# Patient Record
Sex: Female | Born: 1935
Health system: Southern US, Community
[De-identification: ages and names within clinical notes are randomized; demographics above are authoritative.]

## PROBLEM LIST (undated history)

## (undated) DIAGNOSIS — M1990 Unspecified osteoarthritis, unspecified site: Secondary | ICD-10-CM

## (undated) DIAGNOSIS — W19XXXA Unspecified fall, initial encounter: Secondary | ICD-10-CM

## (undated) DIAGNOSIS — R011 Cardiac murmur, unspecified: Secondary | ICD-10-CM

## (undated) DIAGNOSIS — E039 Hypothyroidism, unspecified: Secondary | ICD-10-CM

## (undated) DIAGNOSIS — Z87440 Personal history of urinary (tract) infections: Secondary | ICD-10-CM

## (undated) DIAGNOSIS — I10 Essential (primary) hypertension: Secondary | ICD-10-CM

## (undated) DIAGNOSIS — R32 Unspecified urinary incontinence: Secondary | ICD-10-CM

## (undated) DIAGNOSIS — K649 Unspecified hemorrhoids: Secondary | ICD-10-CM

## (undated) HISTORY — PX: TONSILLECTOMY: SUR1361

## (undated) HISTORY — PX: CHOLECYSTECTOMY: SHX55

## (undated) HISTORY — PX: ANKLE SURGERY: SHX546

## (undated) HISTORY — PX: APPENDECTOMY: SHX54

## (undated) HISTORY — PX: EYE SURGERY: SHX253

## (undated) HISTORY — PX: HEMORRHOID SURGERY: SHX153

## (undated) HISTORY — PX: OTHER SURGICAL HISTORY: SHX169

## (undated) HISTORY — PX: HERNIA REPAIR: SHX51

## (undated) HISTORY — PX: ABDOMINAL HYSTERECTOMY: SHX81

---

## 1998-06-06 ENCOUNTER — Observation Stay (HOSPITAL_COMMUNITY): Admission: RE | Admit: 1998-06-06 | Discharge: 1998-06-07 | Payer: Self-pay | Admitting: Obstetrics and Gynecology

## 1999-07-02 ENCOUNTER — Encounter: Payer: Self-pay | Admitting: Family Medicine

## 1999-07-02 ENCOUNTER — Encounter: Admission: RE | Admit: 1999-07-02 | Discharge: 1999-07-02 | Payer: Self-pay | Admitting: Family Medicine

## 2001-04-23 ENCOUNTER — Encounter: Admission: RE | Admit: 2001-04-23 | Discharge: 2001-04-23 | Payer: Self-pay | Admitting: Family Medicine

## 2001-04-23 ENCOUNTER — Encounter: Payer: Self-pay | Admitting: Family Medicine

## 2001-05-17 ENCOUNTER — Ambulatory Visit (HOSPITAL_COMMUNITY): Admission: RE | Admit: 2001-05-17 | Discharge: 2001-05-17 | Payer: Self-pay | Admitting: Gastroenterology

## 2001-09-01 ENCOUNTER — Encounter: Admission: RE | Admit: 2001-09-01 | Discharge: 2001-09-01 | Payer: Self-pay | Admitting: Family Medicine

## 2001-09-01 ENCOUNTER — Encounter: Payer: Self-pay | Admitting: Family Medicine

## 2001-11-01 ENCOUNTER — Encounter: Payer: Self-pay | Admitting: Family Medicine

## 2001-11-01 ENCOUNTER — Encounter: Admission: RE | Admit: 2001-11-01 | Discharge: 2001-11-01 | Payer: Self-pay | Admitting: Family Medicine

## 2002-05-12 DIAGNOSIS — W19XXXA Unspecified fall, initial encounter: Secondary | ICD-10-CM

## 2002-05-12 HISTORY — DX: Unspecified fall, initial encounter: W19.XXXA

## 2002-08-15 ENCOUNTER — Other Ambulatory Visit: Admission: RE | Admit: 2002-08-15 | Discharge: 2002-08-15 | Payer: Self-pay | Admitting: Obstetrics and Gynecology

## 2004-05-07 ENCOUNTER — Encounter: Admission: RE | Admit: 2004-05-07 | Discharge: 2004-05-07 | Payer: Self-pay | Admitting: Family Medicine

## 2004-06-06 ENCOUNTER — Ambulatory Visit (HOSPITAL_COMMUNITY): Admission: RE | Admit: 2004-06-06 | Discharge: 2004-06-06 | Payer: Self-pay | Admitting: Surgery

## 2004-06-06 ENCOUNTER — Encounter (INDEPENDENT_AMBULATORY_CARE_PROVIDER_SITE_OTHER): Payer: Self-pay | Admitting: *Deleted

## 2005-07-09 ENCOUNTER — Encounter: Admission: RE | Admit: 2005-07-09 | Discharge: 2005-07-09 | Payer: Self-pay | Admitting: Surgery

## 2008-02-02 ENCOUNTER — Encounter: Admission: RE | Admit: 2008-02-02 | Discharge: 2008-02-02 | Payer: Self-pay | Admitting: Family Medicine

## 2008-07-04 ENCOUNTER — Emergency Department (HOSPITAL_COMMUNITY): Admission: EM | Admit: 2008-07-04 | Discharge: 2008-07-04 | Payer: Self-pay | Admitting: Emergency Medicine

## 2008-07-04 IMAGING — CR DG PELVIS 1-2V
1 series · 1 of 1 positions shown · non-contrast
Comparison: CT abdomen pelvis [DATE]

CLINICAL DATA: Fall, right hip pain

PELVIS - 1-2 VIEW

[t pelvis a.p.]
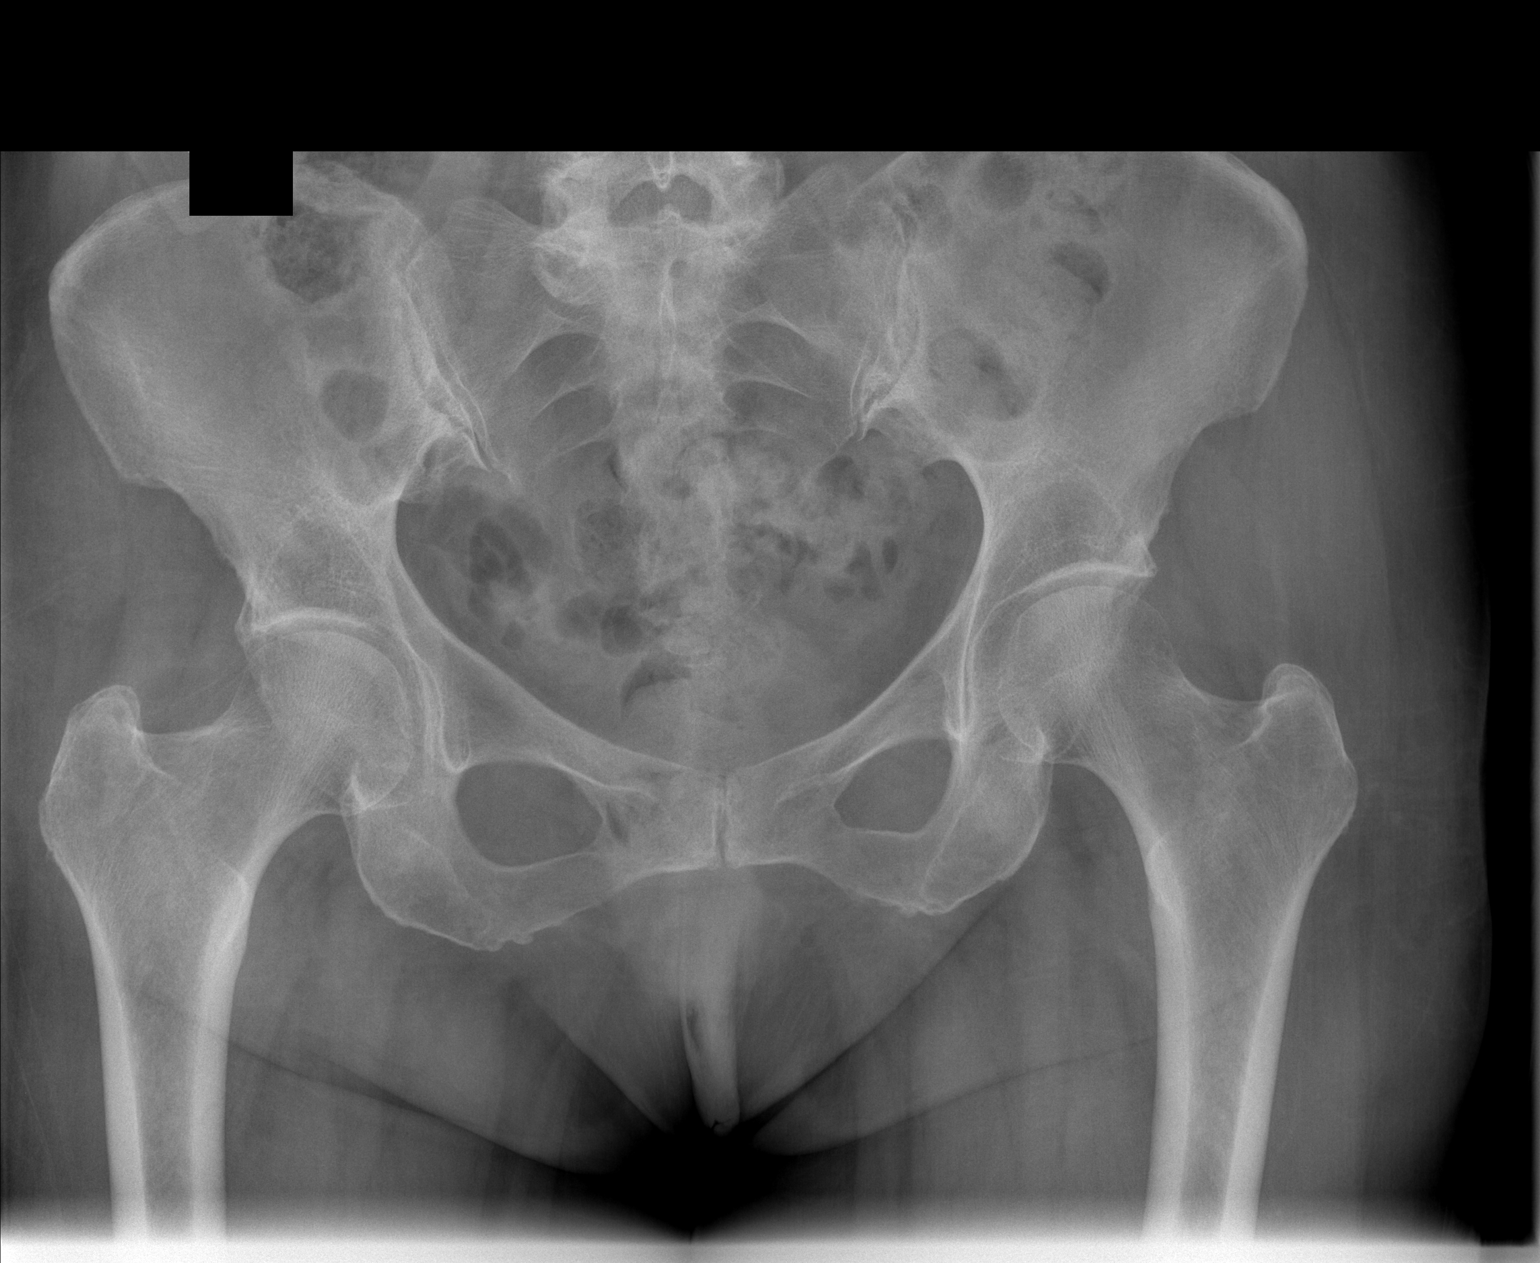

[1 of 1 positions shown; findings below may reference images not displayed]

FINDINGS: Hips are located.  No evidence of femoral neck fracture
on single view.  No evidence of pelvic fracture or sacral fracture.
IMPRESSION: No evidence of hip fracture or pelvic fracture.

## 2009-05-16 ENCOUNTER — Encounter: Admission: RE | Admit: 2009-05-16 | Discharge: 2009-05-16 | Payer: Self-pay | Admitting: Family Medicine

## 2009-05-17 ENCOUNTER — Emergency Department (HOSPITAL_COMMUNITY): Admission: EM | Admit: 2009-05-17 | Discharge: 2009-05-18 | Payer: Self-pay | Admitting: Emergency Medicine

## 2009-07-19 ENCOUNTER — Encounter: Admission: RE | Admit: 2009-07-19 | Discharge: 2009-07-19 | Payer: Self-pay | Admitting: Family Medicine

## 2010-06-01 ENCOUNTER — Encounter: Payer: Self-pay | Admitting: Family Medicine

## 2010-06-02 ENCOUNTER — Encounter: Payer: Self-pay | Admitting: Family Medicine

## 2010-08-27 LAB — COMPREHENSIVE METABOLIC PANEL
ALT: 125 U/L — ABNORMAL HIGH (ref 0–35)
Alkaline Phosphatase: 83 U/L (ref 39–117)
BUN: 8 mg/dL (ref 6–23)
CO2: 26 mEq/L (ref 19–32)
Chloride: 107 mEq/L (ref 96–112)
Glucose, Bld: 113 mg/dL — ABNORMAL HIGH (ref 70–99)
Potassium: 4.6 mEq/L (ref 3.5–5.1)
Sodium: 139 mEq/L (ref 135–145)
Total Bilirubin: 0.5 mg/dL (ref 0.3–1.2)
Total Protein: 6.7 g/dL (ref 6.0–8.3)

## 2010-08-27 LAB — DIFFERENTIAL
Basophils Absolute: 0 10*3/uL (ref 0.0–0.1)
Basophils Relative: 0 % (ref 0–1)
Eosinophils Absolute: 0 10*3/uL (ref 0.0–0.7)
Monocytes Relative: 3 % (ref 3–12)
Neutro Abs: 10.3 10*3/uL — ABNORMAL HIGH (ref 1.7–7.7)
Neutrophils Relative %: 92 % — ABNORMAL HIGH (ref 43–77)

## 2010-08-27 LAB — CBC
HCT: 36.8 % (ref 36.0–46.0)
Hemoglobin: 12.9 g/dL (ref 12.0–15.0)
RBC: 4.05 MIL/uL (ref 3.87–5.11)
WBC: 11.2 10*3/uL — ABNORMAL HIGH (ref 4.0–10.5)

## 2010-08-27 LAB — POCT CARDIAC MARKERS
CKMB, poc: <1 ng/mL — ABNORMAL LOW (ref 1.0–8.0)
Troponin i, poc: <0.05 ng/mL (ref 0.00–0.09)

## 2010-09-27 NOTE — Procedures (Signed)
[facility]  Patient:    Judy Lindsey, Judy Lindsey Visit Number: [id number] MRN: [id number]          Service Type: END Location: ENDO Attending Physician:  Nelda Marseille Dictated by:   Petra Kuba, M.D. Proc. Date: 05/17/01 Admit Date:  05/17/2001 Discharge Date: 05/17/2001   CC:         Delorse Lek, M.D.   Procedure Report  PROCEDURE PERFORMED:  Colonoscopy.  ENDOSCOPIST:  Petra Kuba, M.D.  INDICATIONS FOR PROCEDURE:  Patient with left lower quadrant pain, history of diverticula, overdue for colonic screening.  Consent was signed after risks, benefits, methods, and options were thoroughly discussed in the office.  MEDICATIONS USED:  Demerol 50 mg, Versed 5 mg.  DESCRIPTION OF PROCEDURE:  Rectal inspection was pertinent for external hemorrhoids small.  Digital exam was negative.  Pediatric video colonoscope was inserted, easily advanced around the colon to the cecum.  This did not require any position changes and only some mild left lower quadrant pressure. On insertion some left-sided diverticula were seen.  The cecum was identified by the appendiceal orifice and the ileocecal valve.  In fact the scope was inserted a short ways in the terminal ileum which was normal. Photodocumentation was obtained.  The scope was slowly withdrawn.  Prep was adequate.  There was some liquid stool that required washing and suctioning but on slow withdrawal through the colon, the cecum, ascending and transverse were normal.  As the scope was withdrawn around the colon, other than some left-sided diverticula, no abnormalities were seen.  Once back in the rectum, the scope was retroflexed, pertinent for some small internal hemorrhoids. Scope was straightened and readvanced a short ways around the left side of the colon.  Air was suctioned, scope removed.  The patient tolerated the procedure well.  There was no obvious immediate  complication.  ENDOSCOPIC DIAGNOSIS: 1. Internal and external small hemorrhoids. 2. Left-sided diverticula. 3. Otherwise within normal limits to the terminal ileum.  PLAN:  Yearly rectals and guaiacs per Dr. Doristine Counter.  Be happy to see back p.r.n.  Will give her the customary tic information.  Avoid nuts, seeds, popcorn, and consider surgical options if multiple bouts of recurring diverticulitis continue and otherwise repeat screening in five to 10 years. Dictated by:   Petra Kuba, M.D. Attending Physician:  Nelda Marseille DD:  05/17/01 TD:  05/17/01 Job: [phone] [id number]

## 2012-02-26 ENCOUNTER — Other Ambulatory Visit: Payer: Self-pay | Admitting: Gastroenterology

## 2012-03-09 ENCOUNTER — Other Ambulatory Visit: Payer: Self-pay | Admitting: Gastroenterology

## 2012-03-09 DIAGNOSIS — R109 Unspecified abdominal pain: Secondary | ICD-10-CM

## 2012-03-12 ENCOUNTER — Ambulatory Visit
Admission: RE | Admit: 2012-03-12 | Discharge: 2012-03-12 | Disposition: A | Discharge status: Home / Self Care | Payer: Medicare Other | Source: Ambulatory Visit | Attending: Gastroenterology | Admitting: Gastroenterology

## 2012-03-12 DIAGNOSIS — R109 Unspecified abdominal pain: Secondary | ICD-10-CM

## 2012-03-12 MED ORDER — IOHEXOL 300 MG/ML  SOLN
75.0000 mL | Freq: Once | INTRAMUSCULAR | Status: AC | PRN
  Administered 2012-03-12: 75 mL via INTRAVENOUS

## 2013-02-10 ENCOUNTER — Other Ambulatory Visit: Payer: Self-pay | Admitting: Family Medicine

## 2013-02-10 DIAGNOSIS — E039 Hypothyroidism, unspecified: Secondary | ICD-10-CM

## 2013-02-11 ENCOUNTER — Other Ambulatory Visit: Payer: Medicare Other

## 2013-02-14 ENCOUNTER — Ambulatory Visit
Admission: RE | Admit: 2013-02-14 | Discharge: 2013-02-14 | Disposition: A | Discharge status: Home / Self Care | Payer: Self-pay | Source: Ambulatory Visit | Attending: Family Medicine | Admitting: Family Medicine

## 2013-02-14 DIAGNOSIS — E039 Hypothyroidism, unspecified: Secondary | ICD-10-CM

## 2014-05-24 DIAGNOSIS — H26491 Other secondary cataract, right eye: Secondary | ICD-10-CM | POA: Diagnosis not present

## 2014-05-24 DIAGNOSIS — H04123 Dry eye syndrome of bilateral lacrimal glands: Secondary | ICD-10-CM | POA: Diagnosis not present

## 2014-08-17 ENCOUNTER — Other Ambulatory Visit: Payer: Self-pay | Admitting: Family Medicine

## 2014-08-17 DIAGNOSIS — M5137 Other intervertebral disc degeneration, lumbosacral region: Secondary | ICD-10-CM

## 2014-08-28 ENCOUNTER — Other Ambulatory Visit: Payer: BC Managed Care – PPO

## 2014-10-19 DIAGNOSIS — M810 Age-related osteoporosis without current pathological fracture: Secondary | ICD-10-CM | POA: Diagnosis not present

## 2014-10-19 DIAGNOSIS — M5137 Other intervertebral disc degeneration, lumbosacral region: Secondary | ICD-10-CM | POA: Diagnosis not present

## 2014-11-21 DIAGNOSIS — R350 Frequency of micturition: Secondary | ICD-10-CM | POA: Diagnosis not present

## 2014-12-14 DIAGNOSIS — E780 Pure hypercholesterolemia: Secondary | ICD-10-CM | POA: Diagnosis not present

## 2015-02-13 DIAGNOSIS — Z01818 Encounter for other preprocedural examination: Secondary | ICD-10-CM | POA: Diagnosis not present

## 2015-02-13 DIAGNOSIS — Z23 Encounter for immunization: Secondary | ICD-10-CM | POA: Diagnosis not present

## 2015-02-13 DIAGNOSIS — I10 Essential (primary) hypertension: Secondary | ICD-10-CM | POA: Diagnosis not present

## 2015-02-13 DIAGNOSIS — E559 Vitamin D deficiency, unspecified: Secondary | ICD-10-CM | POA: Diagnosis not present

## 2015-02-13 DIAGNOSIS — M810 Age-related osteoporosis without current pathological fracture: Secondary | ICD-10-CM | POA: Diagnosis not present

## 2015-02-13 DIAGNOSIS — E7800 Pure hypercholesterolemia, unspecified: Secondary | ICD-10-CM | POA: Diagnosis not present

## 2015-02-13 DIAGNOSIS — E039 Hypothyroidism, unspecified: Secondary | ICD-10-CM | POA: Diagnosis not present

## 2015-02-19 DIAGNOSIS — H02052 Trichiasis without entropian right lower eyelid: Secondary | ICD-10-CM | POA: Diagnosis not present

## 2015-02-19 DIAGNOSIS — H02055 Trichiasis without entropian left lower eyelid: Secondary | ICD-10-CM | POA: Diagnosis not present

## 2015-02-19 DIAGNOSIS — H5713 Ocular pain, bilateral: Secondary | ICD-10-CM | POA: Diagnosis not present

## 2015-03-29 DIAGNOSIS — M5137 Other intervertebral disc degeneration, lumbosacral region: Secondary | ICD-10-CM | POA: Diagnosis not present

## 2015-03-29 DIAGNOSIS — M1990 Unspecified osteoarthritis, unspecified site: Secondary | ICD-10-CM | POA: Diagnosis not present

## 2015-03-29 DIAGNOSIS — R5383 Other fatigue: Secondary | ICD-10-CM | POA: Diagnosis not present

## 2015-03-29 DIAGNOSIS — E7800 Pure hypercholesterolemia, unspecified: Secondary | ICD-10-CM | POA: Diagnosis not present

## 2015-04-11 DIAGNOSIS — M25562 Pain in left knee: Secondary | ICD-10-CM | POA: Diagnosis not present

## 2015-04-23 DIAGNOSIS — H02532 Eyelid retraction right lower eyelid: Secondary | ICD-10-CM | POA: Diagnosis not present

## 2015-04-23 DIAGNOSIS — H04222 Epiphora due to insufficient drainage, left lacrimal gland: Secondary | ICD-10-CM | POA: Diagnosis not present

## 2015-04-23 DIAGNOSIS — H02535 Eyelid retraction left lower eyelid: Secondary | ICD-10-CM | POA: Diagnosis not present

## 2015-04-23 DIAGNOSIS — H11433 Conjunctival hyperemia, bilateral: Secondary | ICD-10-CM | POA: Diagnosis not present

## 2015-04-23 DIAGNOSIS — M25562 Pain in left knee: Secondary | ICD-10-CM | POA: Diagnosis not present

## 2015-04-23 DIAGNOSIS — H04221 Epiphora due to insufficient drainage, right lacrimal gland: Secondary | ICD-10-CM | POA: Diagnosis not present

## 2015-04-23 DIAGNOSIS — M1712 Unilateral primary osteoarthritis, left knee: Secondary | ICD-10-CM | POA: Diagnosis not present

## 2015-05-30 DIAGNOSIS — M1712 Unilateral primary osteoarthritis, left knee: Secondary | ICD-10-CM | POA: Diagnosis not present

## 2015-05-30 DIAGNOSIS — M25562 Pain in left knee: Secondary | ICD-10-CM | POA: Diagnosis not present

## 2015-06-04 DIAGNOSIS — H02535 Eyelid retraction left lower eyelid: Secondary | ICD-10-CM | POA: Diagnosis not present

## 2015-06-04 DIAGNOSIS — H02532 Eyelid retraction right lower eyelid: Secondary | ICD-10-CM | POA: Diagnosis not present

## 2015-06-13 DIAGNOSIS — E039 Hypothyroidism, unspecified: Secondary | ICD-10-CM | POA: Diagnosis not present

## 2015-06-13 DIAGNOSIS — E7800 Pure hypercholesterolemia, unspecified: Secondary | ICD-10-CM | POA: Diagnosis not present

## 2015-06-13 DIAGNOSIS — R309 Painful micturition, unspecified: Secondary | ICD-10-CM | POA: Diagnosis not present

## 2015-06-13 DIAGNOSIS — E559 Vitamin D deficiency, unspecified: Secondary | ICD-10-CM | POA: Diagnosis not present

## 2015-06-15 NOTE — H&P (Signed)
TOTAL KNEE ADMISSION H&P  Patient is being admitted for left total knee arthroplasty.  Subjective:  Chief Complaint:   Left knee primary OA / pain  HPI: Dyanne Carrel, 80 y.o. female, has a history of pain and functional disability in the left knee due to arthritis and has failed non-surgical conservative treatments for greater than 12 weeks to include NSAID's and/or analgesics, corticosteriod injections and activity modification.  Onset of symptoms was gradual, starting 4-5 months ago with rapidlly worsening course since that time. The patient noted no past surgery on the left knee(s).  Patient currently rates pain in the left knee(s) at 10 out of 10 with activity. Patient has night pain, worsening of pain with activity and weight bearing, pain that interferes with activities of daily living, pain with passive range of motion, crepitus and joint swelling.  Patient has evidence of periarticular osteophytes and joint space narrowing by imaging studies.  There is no active infection.   Risks, benefits and expectations were discussed with the patient.  Risks including but not limited to the risk of anesthesia, blood clots, nerve damage, blood vessel damage, failure of the prosthesis, infection and up to and including death.  Patient understand the risks, benefits and expectations and wishes to proceed with surgery.   PCP: Astrid Divine, MD  D/C Plans:      SNF  Post-op Meds:       No Rx given   Tranexamic Acid:      To be given - IV   Decadron:      Is to be given  FYI:     ASA  Norco    Past Medical History  Diagnosis Date  . Heart murmur   . Hypertension   . Hypothyroidism   . History of urinary tract infection   . Urinary incontinence   . Arthritis   . Hemorrhoids   . Fall     down stairs     Past Surgical History  Procedure Laterality Date  . Hemorrhoid surgery    . Abdominal hysterectomy    . Cholecystectomy    . Appendectomy    . Ankle surgery      right had  screw placed still has   . Tonsillectomy    . Tummy tuck       20 years ago   . Eye surgery      cataract surgery bilat   . Hernia repair    . Arm surgery       had excess skin removed from under upper arm area bilat 05/16/2015     No prescriptions prior to admission   Allergies  Allergen Reactions  . Codeine Nausea And Vomiting  . Nubain [Nalbuphine] Nausea And Vomiting     Social History  Substance Use Topics  . Smoking status: Never Smoker   . Smokeless tobacco: Never Used  . Alcohol Use: Yes     Comment: occas glass of wine     No family history on file.   Review of Systems  Constitutional: Negative.   HENT: Negative.   Eyes: Negative.   Respiratory: Negative.   Cardiovascular: Negative.   Gastrointestinal: Negative.   Genitourinary: Negative.   Musculoskeletal: Positive for joint pain.  Skin: Negative.   Neurological: Negative.   Endo/Heme/Allergies: Positive for environmental allergies.  Psychiatric/Behavioral: Negative.     Objective:  Physical Exam  Constitutional: She is oriented to person, place, and time. She appears well-developed.  HENT:  Head: Normocephalic.  Eyes: Pupils  are equal, round, and reactive to light.  Neck: Neck supple. No JVD present. No tracheal deviation present. No thyromegaly present.  Cardiovascular: Normal rate, regular rhythm and intact distal pulses.   Murmur heard. Respiratory: Effort normal and breath sounds normal. No stridor. No respiratory distress. She has no wheezes.  GI: Soft. There is no tenderness. There is no guarding.  Musculoskeletal:       Left knee: She exhibits decreased range of motion, swelling and bony tenderness. She exhibits no ecchymosis, no deformity, no laceration and no erythema. Tenderness found.  Lymphadenopathy:    She has no cervical adenopathy.  Neurological: She is alert and oriented to person, place, and time.  Skin: Skin is warm and dry.  Psychiatric: She has a normal mood and affect.       Imaging Review Plain radiographs demonstrate severe degenerative joint disease of the left knee(s). The overall alignment is neutral. The bone quality appears to be good for age and reported activity level.  Assessment/Plan:  End stage arthritis, left knee   The patient history, physical examination, clinical judgment of the provider and imaging studies are consistent with end stage degenerative joint disease of the left knee(s) and total knee arthroplasty is deemed medically necessary. The treatment options including medical management, injection therapy arthroscopy and arthroplasty were discussed at length. The risks and benefits of total knee arthroplasty were presented and reviewed. The risks due to aseptic loosening, infection, stiffness, patella tracking problems, thromboembolic complications and other imponderables were discussed. The patient acknowledged the explanation, agreed to proceed with the plan and consent was signed. Patient is being admitted for inpatient treatment for surgery, pain control, PT, OT, prophylactic antibiotics, VTE prophylaxis, progressive ambulation and ADL's and discharge planning. The patient is planning to be discharged to skilled nursing facility.     Anastasio Auerbach Charelle Petrakis   PA-C  07/03/2015, 10:03 AM

## 2015-06-26 DIAGNOSIS — L814 Other melanin hyperpigmentation: Secondary | ICD-10-CM | POA: Diagnosis not present

## 2015-06-26 DIAGNOSIS — D1801 Hemangioma of skin and subcutaneous tissue: Secondary | ICD-10-CM | POA: Diagnosis not present

## 2015-06-26 DIAGNOSIS — L218 Other seborrheic dermatitis: Secondary | ICD-10-CM | POA: Diagnosis not present

## 2015-06-26 DIAGNOSIS — L853 Xerosis cutis: Secondary | ICD-10-CM | POA: Diagnosis not present

## 2015-06-26 DIAGNOSIS — D2271 Melanocytic nevi of right lower limb, including hip: Secondary | ICD-10-CM | POA: Diagnosis not present

## 2015-06-26 DIAGNOSIS — L821 Other seborrheic keratosis: Secondary | ICD-10-CM | POA: Diagnosis not present

## 2015-06-26 DIAGNOSIS — L280 Lichen simplex chronicus: Secondary | ICD-10-CM | POA: Diagnosis not present

## 2015-06-26 DIAGNOSIS — D2272 Melanocytic nevi of left lower limb, including hip: Secondary | ICD-10-CM | POA: Diagnosis not present

## 2015-07-02 ENCOUNTER — Encounter (HOSPITAL_COMMUNITY)
Admission: RE | Admit: 2015-07-02 | Discharge: 2015-07-02 | Disposition: A | Discharge status: Home / Self Care | Payer: Medicare PPO | Source: Ambulatory Visit | Attending: Orthopedic Surgery | Admitting: Orthopedic Surgery

## 2015-07-02 ENCOUNTER — Encounter (HOSPITAL_COMMUNITY): Payer: Self-pay

## 2015-07-02 DIAGNOSIS — M1712 Unilateral primary osteoarthritis, left knee: Secondary | ICD-10-CM | POA: Diagnosis not present

## 2015-07-02 DIAGNOSIS — Z0183 Encounter for blood typing: Secondary | ICD-10-CM | POA: Insufficient documentation

## 2015-07-02 DIAGNOSIS — Z01812 Encounter for preprocedural laboratory examination: Secondary | ICD-10-CM | POA: Insufficient documentation

## 2015-07-02 HISTORY — DX: Hypothyroidism, unspecified: E03.9

## 2015-07-02 HISTORY — DX: Essential (primary) hypertension: I10

## 2015-07-02 HISTORY — DX: Cardiac murmur, unspecified: R01.1

## 2015-07-02 HISTORY — DX: Personal history of urinary (tract) infections: Z87.440

## 2015-07-02 HISTORY — DX: Unspecified urinary incontinence: R32

## 2015-07-02 HISTORY — DX: Unspecified osteoarthritis, unspecified site: M19.90

## 2015-07-02 HISTORY — DX: Unspecified hemorrhoids: K64.9

## 2015-07-02 HISTORY — DX: Unspecified fall, initial encounter: W19.XXXA

## 2015-07-02 LAB — BASIC METABOLIC PANEL
Anion gap: 8 (ref 5–15)
BUN: 11 mg/dL (ref 6–20)
CALCIUM: 9.4 mg/dL (ref 8.9–10.3)
CHLORIDE: 106 mmol/L (ref 101–111)
CO2: 28 mmol/L (ref 22–32)
CREATININE: 0.52 mg/dL (ref 0.44–1.00)
GFR calc non Af Amer: >60 mL/min (ref >60)
GLUCOSE: 93 mg/dL (ref 65–99)
Potassium: 4.4 mmol/L (ref 3.5–5.1)
Sodium: 142 mmol/L (ref 135–145)

## 2015-07-02 LAB — SURGICAL PCR SCREEN
MRSA, PCR: NEGATIVE
STAPHYLOCOCCUS AUREUS: NEGATIVE

## 2015-07-02 LAB — CBC
HCT: 38.1 % (ref 36.0–46.0)
Hemoglobin: 12.2 g/dL (ref 12.0–15.0)
MCH: 29.6 pg (ref 26.0–34.0)
MCHC: 32 g/dL (ref 30.0–36.0)
MCV: 92.5 fL (ref 78.0–100.0)
PLATELETS: 241 10*3/uL (ref 150–400)
RBC: 4.12 MIL/uL (ref 3.87–5.11)
RDW: 13.8 % (ref 11.5–15.5)
WBC: 7.4 10*3/uL (ref 4.0–10.5)

## 2015-07-02 LAB — PROTIME-INR
INR: 0.97 (ref 0.00–1.49)
PROTHROMBIN TIME: 13.1 s (ref 11.6–15.2)

## 2015-07-02 LAB — APTT: aPTT: 29 seconds (ref 24–37)

## 2015-07-02 NOTE — Progress Notes (Signed)
Clearance note per chart per Dr Valentina Lucks 06/01/2015 EKG per chart 02/13/2015

## 2015-07-02 NOTE — Patient Instructions (Signed)
Judy Lindsey  07/02/2015   Your procedure is scheduled on: Tuesday July 10, 2015   Report to [facility] Main  Entrance take [place]  elevators to 3rd floor to  Short Stay Center at 12:45 PM.  Call this number if you have problems the morning of surgery [phone]   Remember: ONLY 1 PERSON MAY GO WITH YOU TO SHORT STAY TO GET  READY MORNING OF YOUR SURGERY.  Do not eat After Midnight but may take clear liquids till 9:45 am day of surgery then nothing by mouth.     Take these medicines the morning of surgery with A SIP OF WATER: Synthroid;                                You may not have any metal on your body including hair pins and              piercings  Do not wear jewelry, make-up, lotions, powders or perfumes, deodorant             Do not wear nail polish.  Do not shave  48 hours prior to surgery.              Men may shave face and neck.   Do not bring valuables to the hospital. [facility] IS NOT             RESPONSIBLE   FOR VALUABLES.  Contacts, dentures or bridgework may not be worn into surgery.                 Please read over the following fact sheets you were given:MRSA INFORMATION SHEET; INCENTIVE SPIROMETER; BLOOD TRANSFUSION INFORMATION SHEET  _____________________________________________________________________             [facility] - Preparing for Surgery Before surgery, you can play an important role.  Because skin is not sterile, your skin needs to be as free of germs as possible.  You can reduce the number of germs on your skin by washing with CHG (chlorahexidine gluconate) soap before surgery.  CHG is an antiseptic cleaner which kills germs and bonds with the skin to continue killing germs even after washing. Please DO NOT use if you have an allergy to CHG or antibacterial soaps.  If your skin becomes reddened/irritated stop using the CHG and inform your nurse when you arrive at Short Stay. Do not shave (including legs and underarms)  for at least 48 hours prior to the first CHG shower.  You may shave your face/neck. Please follow these instructions carefully:  1.  Shower with CHG Soap the night before surgery and the  morning of Surgery.  2.  If you choose to wash your hair, wash your hair first as usual with your  normal  shampoo.  3.  After you shampoo, rinse your hair and body thoroughly to remove the  shampoo.                           4.  Use CHG as you would any other liquid soap.  You can apply chg directly  to the skin and wash                       Gently with a scrungie or clean  washcloth.  5.  Apply the CHG Soap to your body ONLY FROM THE NECK DOWN.   Do not use on face/ open                           Wound or open sores. Avoid contact with eyes, ears mouth and genitals (private parts).                       Wash face,  Genitals (private parts) with your normal soap.             6.  Wash thoroughly, paying special attention to the area where your surgery  will be performed.  7.  Thoroughly rinse your body with warm water from the neck down.  8.  DO NOT shower/wash with your normal soap after using and rinsing off  the CHG Soap.                9.  Pat yourself dry with a clean towel.            10.  Wear clean pajamas.            11.  Place clean sheets on your bed the night of your first shower and do not  sleep with pets. Day of Surgery : Do not apply any lotions/deodorants the morning of surgery.  Please wear clean clothes to the hospital/surgery center.  FAILURE TO FOLLOW THESE INSTRUCTIONS MAY RESULT IN THE CANCELLATION OF YOUR SURGERY PATIENT SIGNATURE_________________________________  NURSE SIGNATURE__________________________________  ________________________________________________________________________    CLEAR LIQUID DIET   Foods Allowed                                                                     Foods Excluded  Coffee and tea, regular and decaf                             liquids  that you cannot  Plain Jell-O in any flavor                                             see through such as: Fruit ices (not with fruit pulp)                                     milk, soups, orange juice  Iced Popsicles                                    All solid food Carbonated beverages, regular and diet                                    Cranberry, grape and apple juices Sports drinks like Gatorade Lightly seasoned clear broth or consume(fat free) Sugar, honey syrup  Sample Menu Breakfast  Lunch                                     Supper Cranberry juice                    Beef broth                            Chicken broth Jell-O                                     Grape juice                           Apple juice Coffee or tea                        Jell-O                                      Popsicle                                                Coffee or tea                        Coffee or tea  _____________________________________________________________________    Incentive Spirometer  An incentive spirometer is a tool that can help keep your lungs clear and active. This tool measures how well you are filling your lungs with each breath. Taking long deep breaths may help reverse or decrease the chance of developing breathing (pulmonary) problems (especially infection) following:  A long period of time when you are unable to move or be active. BEFORE THE PROCEDURE   If the spirometer includes an indicator to show your best effort, your nurse or respiratory therapist will set it to a desired goal.  If possible, sit up straight or lean slightly forward. Try not to slouch.  Hold the incentive spirometer in an upright position. INSTRUCTIONS FOR USE   Sit on the edge of your bed if possible, or sit up as far as you can in bed or on a chair.  Hold the incentive spirometer in an upright position.  Breathe out normally.  Place the mouthpiece in  your mouth and seal your lips tightly around it.  Breathe in slowly and as deeply as possible, raising the piston or the ball toward the top of the column.  Hold your breath for 3-5 seconds or for as long as possible. Allow the piston or ball to fall to the bottom of the column.  Remove the mouthpiece from your mouth and breathe out normally.  Rest for a few seconds and repeat Steps 1 through 7 at least 10 times every 1-2 hours when you are awake. Take your time and take a few normal breaths between deep breaths.  The spirometer may include an indicator to show your best effort. Use the indicator as a goal to work toward during each repetition.  After each set of 10 deep breaths,  practice coughing to be sure your lungs are clear. If you have an incision (the cut made at the time of surgery), support your incision when coughing by placing a pillow or rolled up towels firmly against it. Once you are able to get out of bed, walk around indoors and cough well. You may stop using the incentive spirometer when instructed by your caregiver.  RISKS AND COMPLICATIONS  Take your time so you do not get dizzy or light-headed.  If you are in pain, you may need to take or ask for pain medication before doing incentive spirometry. It is harder to take a deep breath if you are having pain. AFTER USE  Rest and breathe slowly and easily.  It can be helpful to keep track of a log of your progress. Your caregiver can provide you with a simple table to help with this. If you are using the spirometer at home, follow these instructions: SEEK MEDICAL CARE IF:   You are having difficultly using the spirometer.  You have trouble using the spirometer as often as instructed.  Your pain medication is not giving enough relief while using the spirometer.  You develop fever of 100.5 F (38.1 C) or higher. SEEK IMMEDIATE MEDICAL CARE IF:   You cough up bloody sputum that had not been present before.  You  develop fever of 102 F (38.9 C) or greater.  You develop worsening pain at or near the incision site. MAKE SURE YOU:   Understand these instructions.  Will watch your condition.  Will get help right away if you are not doing well or get worse. Document Released: 09/08/2006 Document Revised: 07/21/2011 Document Reviewed: 11/09/2006 Exit[employer] Patient Information 2014 ExitCare, [place].   ________________________________________________________________________  WHAT IS A BLOOD TRANSFUSION? Blood Transfusion Information  A transfusion is the replacement of blood or some of its parts. Blood is made up of multiple cells which provide different functions.  Red blood cells carry oxygen and are used for blood loss replacement.  White blood cells fight against infection.  Platelets control bleeding.  Plasma helps clot blood.  Other blood products are available for specialized needs, such as hemophilia or other clotting disorders. BEFORE THE TRANSFUSION  Who gives blood for transfusions?   Healthy volunteers who are fully evaluated to make sure their blood is safe. This is blood bank blood. Transfusion therapy is the safest it has ever been in the practice of medicine. Before blood is taken from a donor, a complete history is taken to make sure that person has no history of diseases nor engages in risky social behavior (examples are intravenous drug use or sexual activity with multiple partners). The donor's travel history is screened to minimize risk of transmitting infections, such as malaria. The donated blood is tested for signs of infectious diseases, such as HIV and hepatitis. The blood is then tested to be sure it is compatible with you in order to minimize the chance of a transfusion reaction. If you or a relative donates blood, this is often done in anticipation of surgery and is not appropriate for emergency situations. It takes many days to process the donated blood. RISKS AND  COMPLICATIONS Although transfusion therapy is very safe and saves many lives, the main dangers of transfusion include:   Getting an infectious disease.  Developing a transfusion reaction. This is an allergic reaction to something in the blood you were given. Every precaution is taken to prevent this. The decision to have a blood transfusion has been  considered carefully by your caregiver before blood is given. Blood is not given unless the benefits outweigh the risks. AFTER THE TRANSFUSION  Right after receiving a blood transfusion, you will usually feel much better and more energetic. This is especially true if your red blood cells have gotten low (anemic). The transfusion raises the level of the red blood cells which carry oxygen, and this usually causes an energy increase.  The nurse administering the transfusion will monitor you carefully for complications. HOME CARE INSTRUCTIONS  No special instructions are needed after a transfusion. You may find your energy is better. Speak with your caregiver about any limitations on activity for underlying diseases you may have. SEEK MEDICAL CARE IF:   Your condition is not improving after your transfusion.  You develop redness or irritation at the intravenous (IV) site. SEEK IMMEDIATE MEDICAL CARE IF:  Any of the following symptoms occur over the next 12 hours:  Shaking chills.  You have a temperature by mouth above 102 F (38.9 C), not controlled by medicine.  Chest, back, or muscle pain.  People around you feel you are not acting correctly or are confused.  Shortness of breath or difficulty breathing.  Dizziness and fainting.  You get a rash or develop hives.  You have a decrease in urine output.  Your urine turns a dark color or changes to pink, red, or brown. Any of the following symptoms occur over the next 10 days:  You have a temperature by mouth above 102 F (38.9 C), not controlled by medicine.  Shortness of  breath.  Weakness after normal activity.  The white part of the eye turns yellow (jaundice).  You have a decrease in the amount of urine or are urinating less often.  Your urine turns a dark color or changes to pink, red, or brown. Document Released: 04/25/2000 Document Revised: 07/21/2011 Document Reviewed: 12/13/2007 [facility] Patient Information 2014 [place], [place].  _______________________________________________________________________

## 2015-07-03 LAB — URINALYSIS, ROUTINE W REFLEX MICROSCOPIC
Bilirubin Urine: NEGATIVE
GLUCOSE, UA: NEGATIVE mg/dL
HGB URINE DIPSTICK: NEGATIVE
KETONES UR: NEGATIVE mg/dL
Leukocytes, UA: NEGATIVE
Nitrite: NEGATIVE
PH: 7 (ref 5.0–8.0)
PROTEIN: NEGATIVE mg/dL
Specific Gravity, Urine: 1.005 (ref 1.005–1.030)

## 2015-07-03 LAB — ABO/RH: ABO/RH(D): O NEG

## 2015-07-05 NOTE — Progress Notes (Signed)
Spoke with pts husband Judy Lindsey in regards to surgical time change. Aware pt is to arrive at Short Stay / [facility] hospital 3rd floor at 9:45 am on 07/10/2015. Pts husband verbalized no food or drink after midnight.

## 2015-07-10 ENCOUNTER — Encounter (HOSPITAL_COMMUNITY)
Admission: RE | Disposition: A | Discharge status: Home Health | Payer: Self-pay | Source: Ambulatory Visit | Attending: Orthopedic Surgery

## 2015-07-10 ENCOUNTER — Encounter (HOSPITAL_COMMUNITY): Payer: Self-pay | Admitting: *Deleted

## 2015-07-10 ENCOUNTER — Inpatient Hospital Stay (HOSPITAL_COMMUNITY): Payer: Medicare PPO | Admitting: Anesthesiology

## 2015-07-10 ENCOUNTER — Inpatient Hospital Stay (HOSPITAL_COMMUNITY)
Admission: RE | Admit: 2015-07-10 | Discharge: 2015-07-13 | DRG: 470 | Disposition: A | Discharge status: Home Health | Payer: Medicare PPO | Source: Ambulatory Visit | Attending: Orthopedic Surgery | Admitting: Orthopedic Surgery

## 2015-07-10 DIAGNOSIS — I10 Essential (primary) hypertension: Secondary | ICD-10-CM | POA: Diagnosis present

## 2015-07-10 DIAGNOSIS — M659 Synovitis and tenosynovitis, unspecified: Secondary | ICD-10-CM | POA: Diagnosis present

## 2015-07-10 DIAGNOSIS — M1712 Unilateral primary osteoarthritis, left knee: Principal | ICD-10-CM | POA: Diagnosis present

## 2015-07-10 DIAGNOSIS — M1612 Unilateral primary osteoarthritis, left hip: Secondary | ICD-10-CM | POA: Diagnosis not present

## 2015-07-10 DIAGNOSIS — E663 Overweight: Secondary | ICD-10-CM | POA: Diagnosis not present

## 2015-07-10 DIAGNOSIS — M179 Osteoarthritis of knee, unspecified: Secondary | ICD-10-CM | POA: Diagnosis not present

## 2015-07-10 DIAGNOSIS — M25562 Pain in left knee: Secondary | ICD-10-CM | POA: Diagnosis present

## 2015-07-10 DIAGNOSIS — E039 Hypothyroidism, unspecified: Secondary | ICD-10-CM | POA: Diagnosis not present

## 2015-07-10 DIAGNOSIS — Z96659 Presence of unspecified artificial knee joint: Secondary | ICD-10-CM

## 2015-07-10 DIAGNOSIS — Z01812 Encounter for preprocedural laboratory examination: Secondary | ICD-10-CM

## 2015-07-10 DIAGNOSIS — Z6826 Body mass index (BMI) 26.0-26.9, adult: Secondary | ICD-10-CM

## 2015-07-10 DIAGNOSIS — Z96652 Presence of left artificial knee joint: Secondary | ICD-10-CM

## 2015-07-10 HISTORY — PX: TOTAL KNEE ARTHROPLASTY: SHX125

## 2015-07-10 LAB — TYPE AND SCREEN
ABO/RH(D): O NEG
Antibody Screen: NEGATIVE

## 2015-07-10 SURGERY — ARTHROPLASTY, KNEE, TOTAL
Anesthesia: Spinal | Site: Knee | Laterality: Left

## 2015-07-10 MED ORDER — CEFAZOLIN SODIUM-DEXTROSE 2-3 GM-% IV SOLR
2.0000 g | INTRAVENOUS | Status: AC
  Administered 2015-07-10: 2 g via INTRAVENOUS

## 2015-07-10 MED ORDER — PHENOL 1.4 % MT LIQD
1.0000 | OROMUCOSAL | Status: DC | PRN
  Filled 2015-07-10: qty 177

## 2015-07-10 MED ORDER — 0.9 % SODIUM CHLORIDE (POUR BTL) OPTIME
TOPICAL | Status: DC | PRN
  Administered 2015-07-10: 1000 mL

## 2015-07-10 MED ORDER — BUPIVACAINE-EPINEPHRINE (PF) 0.25% -1:200000 IJ SOLN
INTRAMUSCULAR | Status: AC
  Filled 2015-07-10: qty 30

## 2015-07-10 MED ORDER — METHOCARBAMOL 1000 MG/10ML IJ SOLN
500.0000 mg | Freq: Four times a day (QID) | INTRAVENOUS | Status: DC | PRN
  Administered 2015-07-10: 500 mg via INTRAVENOUS
  Filled 2015-07-10 (×2): qty 5

## 2015-07-10 MED ORDER — CEFAZOLIN SODIUM-DEXTROSE 2-3 GM-% IV SOLR
INTRAVENOUS | Status: AC
  Filled 2015-07-10: qty 50

## 2015-07-10 MED ORDER — DEXAMETHASONE SODIUM PHOSPHATE 10 MG/ML IJ SOLN
INTRAMUSCULAR | Status: AC
  Filled 2015-07-10: qty 1

## 2015-07-10 MED ORDER — POTASSIUM CHLORIDE 2 MEQ/ML IV SOLN
INTRAVENOUS | Status: DC
  Administered 2015-07-10 – 2015-07-11 (×2): via INTRAVENOUS
  Filled 2015-07-10 (×9): qty 1000

## 2015-07-10 MED ORDER — TRANEXAMIC ACID 1000 MG/10ML IV SOLN
1000.0000 mg | Freq: Once | INTRAVENOUS | Status: AC
  Administered 2015-07-10: 1000 mg via INTRAVENOUS
  Filled 2015-07-10: qty 10

## 2015-07-10 MED ORDER — ALUM & MAG HYDROXIDE-SIMETH 200-200-20 MG/5ML PO SUSP
30.0000 mL | ORAL | Status: DC | PRN
  Administered 2015-07-12: 30 mL via ORAL
  Filled 2015-07-10: qty 30

## 2015-07-10 MED ORDER — PHENYLEPHRINE HCL 10 MG/ML IJ SOLN
10.0000 mg | INTRAVENOUS | Status: DC | PRN
  Administered 2015-07-10: 40 ug/min via INTRAVENOUS

## 2015-07-10 MED ORDER — PROPOFOL 10 MG/ML IV BOLUS
INTRAVENOUS | Status: DC | PRN
  Administered 2015-07-10: 30 mg via INTRAVENOUS
  Administered 2015-07-10: 20 mg via INTRAVENOUS
  Administered 2015-07-10: 30 mg via INTRAVENOUS

## 2015-07-10 MED ORDER — FENTANYL CITRATE (PF) 100 MCG/2ML IJ SOLN
25.0000 ug | INTRAMUSCULAR | Status: DC | PRN
Start: 2015-07-10 — End: 2015-07-10
  Administered 2015-07-10 (×2): 50 ug via INTRAVENOUS

## 2015-07-10 MED ORDER — BUPIVACAINE IN DEXTROSE 0.75-8.25 % IT SOLN
INTRATHECAL | Status: DC | PRN
  Administered 2015-07-10: 1.8 mL via INTRATHECAL

## 2015-07-10 MED ORDER — LACTATED RINGERS IV SOLN
INTRAVENOUS | Status: DC
  Administered 2015-07-10: 16:00:00 via INTRAVENOUS
  Administered 2015-07-10: 1000 mL via INTRAVENOUS
  Administered 2015-07-10: 14:00:00 via INTRAVENOUS

## 2015-07-10 MED ORDER — PROMETHAZINE HCL 25 MG/ML IJ SOLN: 6.2500 mg | INTRAMUSCULAR | Status: DC | PRN

## 2015-07-10 MED ORDER — MEPERIDINE HCL 50 MG/ML IJ SOLN: 6.2500 mg | INTRAMUSCULAR | Status: DC | PRN

## 2015-07-10 MED ORDER — MAGNESIUM CITRATE PO SOLN: 1.0000 | Freq: Once | ORAL | Status: DC | PRN

## 2015-07-10 MED ORDER — HYDROMORPHONE HCL 1 MG/ML IJ SOLN
0.5000 mg | INTRAMUSCULAR | Status: DC | PRN
  Administered 2015-07-10 (×3): 0.5 mg via INTRAVENOUS
  Administered 2015-07-11 (×2): 1 mg via INTRAVENOUS
  Filled 2015-07-10 (×5): qty 1

## 2015-07-10 MED ORDER — ROSUVASTATIN CALCIUM 10 MG PO TABS
10.0000 mg | ORAL_TABLET | Freq: Every day | ORAL | Status: DC
  Filled 2015-07-10 (×4): qty 1

## 2015-07-10 MED ORDER — KETOROLAC TROMETHAMINE 30 MG/ML IJ SOLN
INTRAMUSCULAR | Status: AC
  Filled 2015-07-10: qty 1

## 2015-07-10 MED ORDER — FENTANYL CITRATE (PF) 100 MCG/2ML IJ SOLN
INTRAMUSCULAR | Status: DC | PRN
  Administered 2015-07-10: 50 ug via INTRAVENOUS

## 2015-07-10 MED ORDER — POLYETHYLENE GLYCOL 3350 17 G PO PACK
17.0000 g | PACK | Freq: Two times a day (BID) | ORAL | Status: DC
  Administered 2015-07-10 – 2015-07-13 (×6): 17 g via ORAL

## 2015-07-10 MED ORDER — METOCLOPRAMIDE HCL 5 MG/ML IJ SOLN: 5.0000 mg | Freq: Three times a day (TID) | INTRAMUSCULAR | Status: DC | PRN

## 2015-07-10 MED ORDER — BISACODYL 10 MG RE SUPP: 10.0000 mg | Freq: Every day | RECTAL | Status: DC | PRN

## 2015-07-10 MED ORDER — SODIUM CHLORIDE 0.9 % IJ SOLN
INTRAMUSCULAR | Status: DC | PRN
  Administered 2015-07-10: 30 mL

## 2015-07-10 MED ORDER — LEVOTHYROXINE SODIUM 100 MCG PO TABS
100.0000 ug | ORAL_TABLET | Freq: Every day | ORAL | Status: DC
  Administered 2015-07-11 – 2015-07-13 (×3): 100 ug via ORAL
  Filled 2015-07-10 (×4): qty 1

## 2015-07-10 MED ORDER — MIDAZOLAM HCL 5 MG/5ML IJ SOLN
INTRAMUSCULAR | Status: DC | PRN
  Administered 2015-07-10: 1 mg via INTRAVENOUS

## 2015-07-10 MED ORDER — ONDANSETRON HCL 4 MG/2ML IJ SOLN
4.0000 mg | Freq: Four times a day (QID) | INTRAMUSCULAR | Status: DC | PRN
  Administered 2015-07-11: 4 mg via INTRAVENOUS
  Filled 2015-07-10: qty 2

## 2015-07-10 MED ORDER — ASPIRIN EC 325 MG PO TBEC
325.0000 mg | DELAYED_RELEASE_TABLET | Freq: Two times a day (BID) | ORAL | Status: DC
  Administered 2015-07-11 – 2015-07-13 (×5): 325 mg via ORAL
  Filled 2015-07-10 (×7): qty 1

## 2015-07-10 MED ORDER — MENTHOL 3 MG MT LOZG: 1.0000 | LOZENGE | OROMUCOSAL | Status: DC | PRN

## 2015-07-10 MED ORDER — PHENYLEPHRINE HCL 10 MG/ML IJ SOLN
INTRAMUSCULAR | Status: AC
  Filled 2015-07-10: qty 1

## 2015-07-10 MED ORDER — METHOCARBAMOL 500 MG PO TABS
500.0000 mg | ORAL_TABLET | Freq: Four times a day (QID) | ORAL | Status: DC | PRN
  Administered 2015-07-10 – 2015-07-11 (×2): 500 mg via ORAL
  Filled 2015-07-10: qty 1

## 2015-07-10 MED ORDER — METOCLOPRAMIDE HCL 10 MG PO TABS: 5.0000 mg | ORAL_TABLET | Freq: Three times a day (TID) | ORAL | Status: DC | PRN

## 2015-07-10 MED ORDER — SODIUM CHLORIDE 0.9 % IJ SOLN
INTRAMUSCULAR | Status: AC
  Filled 2015-07-10: qty 50

## 2015-07-10 MED ORDER — ONDANSETRON HCL 4 MG PO TABS: 4.0000 mg | ORAL_TABLET | Freq: Four times a day (QID) | ORAL | Status: DC | PRN

## 2015-07-10 MED ORDER — ONDANSETRON HCL 4 MG/2ML IJ SOLN
INTRAMUSCULAR | Status: DC | PRN
  Administered 2015-07-10: 4 mg via INTRAVENOUS

## 2015-07-10 MED ORDER — BUPIVACAINE-EPINEPHRINE (PF) 0.25% -1:200000 IJ SOLN
INTRAMUSCULAR | Status: DC | PRN
  Administered 2015-07-10: 30 mL

## 2015-07-10 MED ORDER — MIDAZOLAM HCL 2 MG/2ML IJ SOLN
INTRAMUSCULAR | Status: AC
  Filled 2015-07-10: qty 2

## 2015-07-10 MED ORDER — DIPHENHYDRAMINE HCL 25 MG PO CAPS: 25.0000 mg | ORAL_CAPSULE | Freq: Four times a day (QID) | ORAL | Status: DC | PRN

## 2015-07-10 MED ORDER — HYDROCODONE-ACETAMINOPHEN 7.5-325 MG PO TABS
1.0000 | ORAL_TABLET | ORAL | Status: DC
  Administered 2015-07-10 – 2015-07-11 (×4): 2 via ORAL
  Filled 2015-07-10 (×4): qty 2

## 2015-07-10 MED ORDER — DEXAMETHASONE SODIUM PHOSPHATE 10 MG/ML IJ SOLN
10.0000 mg | Freq: Once | INTRAMUSCULAR | Status: AC
  Administered 2015-07-11: 10 mg via INTRAVENOUS
  Filled 2015-07-10: qty 1

## 2015-07-10 MED ORDER — PROPOFOL 500 MG/50ML IV EMUL
INTRAVENOUS | Status: DC | PRN
  Administered 2015-07-10: 50 ug/kg/min via INTRAVENOUS

## 2015-07-10 MED ORDER — FENTANYL CITRATE (PF) 100 MCG/2ML IJ SOLN
INTRAMUSCULAR | Status: AC
  Filled 2015-07-10: qty 2

## 2015-07-10 MED ORDER — FERROUS SULFATE 325 (65 FE) MG PO TABS
325.0000 mg | ORAL_TABLET | Freq: Three times a day (TID) | ORAL | Status: DC
  Administered 2015-07-11 – 2015-07-13 (×7): 325 mg via ORAL
  Filled 2015-07-10 (×10): qty 1

## 2015-07-10 MED ORDER — CHLORHEXIDINE GLUCONATE 4 % EX LIQD: 60.0000 mL | Freq: Once | CUTANEOUS | Status: DC

## 2015-07-10 MED ORDER — CEFAZOLIN SODIUM-DEXTROSE 2-3 GM-% IV SOLR
2.0000 g | Freq: Four times a day (QID) | INTRAVENOUS | Status: AC
Start: 2015-07-10 — End: 2015-07-11
  Administered 2015-07-11: 2 g via INTRAVENOUS
  Filled 2015-07-10 (×2): qty 50

## 2015-07-10 MED ORDER — DEXAMETHASONE SODIUM PHOSPHATE 10 MG/ML IJ SOLN
10.0000 mg | Freq: Once | INTRAMUSCULAR | Status: AC
  Administered 2015-07-10: 10 mg via INTRAVENOUS

## 2015-07-10 MED ORDER — KETOROLAC TROMETHAMINE 30 MG/ML IJ SOLN
INTRAMUSCULAR | Status: DC | PRN
  Administered 2015-07-10: 30 mg

## 2015-07-10 MED ORDER — DOCUSATE SODIUM 100 MG PO CAPS
100.0000 mg | ORAL_CAPSULE | Freq: Two times a day (BID) | ORAL | Status: DC
  Administered 2015-07-10 – 2015-07-13 (×6): 100 mg via ORAL

## 2015-07-10 MED ORDER — CELECOXIB 200 MG PO CAPS
200.0000 mg | ORAL_CAPSULE | Freq: Two times a day (BID) | ORAL | Status: DC
  Filled 2015-07-10 (×2): qty 1

## 2015-07-10 SURGICAL SUPPLY — 48 items
BAG DECANTER FOR FLEXI CONT (MISCELLANEOUS) IMPLANT
BAG SPEC THK2 15X12 ZIP CLS (MISCELLANEOUS)
BAG ZIPLOCK 12X15 (MISCELLANEOUS) IMPLANT
BANDAGE ACE 6X5 VEL STRL LF (GAUZE/BANDAGES/DRESSINGS) ×3 IMPLANT
BLADE SAW SGTL 13.0X1.19X90.0M (BLADE) ×3 IMPLANT
BOWL SMART MIX CTS (DISPOSABLE) ×3 IMPLANT
CAPT KNEE TOTAL 3 ATTUNE ×2 IMPLANT
CEMENT HV SMART SET (Cement) ×4 IMPLANT
CLOTH BEACON ORANGE TIMEOUT ST (SAFETY) ×3 IMPLANT
CUFF TOURN SGL QUICK 34 (TOURNIQUET CUFF) ×3
CUFF TRNQT CYL 34X4X40X1 (TOURNIQUET CUFF) ×1 IMPLANT
DECANTER SPIKE VIAL GLASS SM (MISCELLANEOUS) ×3 IMPLANT
DRAPE U-SHAPE 47X51 STRL (DRAPES) ×3 IMPLANT
DRESSING AQUACEL AG SP 3.5X10 (GAUZE/BANDAGES/DRESSINGS) IMPLANT
DRSG AQUACEL AG ADV 3.5X10 (GAUZE/BANDAGES/DRESSINGS) ×3 IMPLANT
DRSG AQUACEL AG SP 3.5X10 (GAUZE/BANDAGES/DRESSINGS) ×3
DURAPREP 26ML APPLICATOR (WOUND CARE) ×6 IMPLANT
ELECT REM PT RETURN 9FT ADLT (ELECTROSURGICAL) ×3
ELECTRODE REM PT RTRN 9FT ADLT (ELECTROSURGICAL) ×1 IMPLANT
GLOVE BIOGEL M 7.0 STRL (GLOVE) IMPLANT
GLOVE BIOGEL PI IND STRL 7.5 (GLOVE) ×1 IMPLANT
GLOVE BIOGEL PI IND STRL 8.5 (GLOVE) ×1 IMPLANT
GLOVE BIOGEL PI INDICATOR 7.5 (GLOVE) ×2
GLOVE BIOGEL PI INDICATOR 8.5 (GLOVE) ×2
GLOVE ECLIPSE 8.0 STRL XLNG CF (GLOVE) ×3 IMPLANT
GLOVE ORTHO TXT STRL SZ7.5 (GLOVE) ×6 IMPLANT
GOWN STRL REUS W/TWL LRG LVL3 (GOWN DISPOSABLE) ×3 IMPLANT
GOWN STRL REUS W/TWL XL LVL3 (GOWN DISPOSABLE) ×3 IMPLANT
HANDPIECE INTERPULSE COAX TIP (DISPOSABLE) ×3
LIQUID BAND (GAUZE/BANDAGES/DRESSINGS) ×3 IMPLANT
MANIFOLD NEPTUNE II (INSTRUMENTS) ×3 IMPLANT
PACK TOTAL KNEE CUSTOM (KITS) ×3 IMPLANT
POSITIONER SURGICAL ARM (MISCELLANEOUS) ×3 IMPLANT
SET HNDPC FAN SPRY TIP SCT (DISPOSABLE) ×1 IMPLANT
SET PAD KNEE POSITIONER (MISCELLANEOUS) ×3 IMPLANT
SUCTION FRAZIER HANDLE 12FR (TUBING) ×2
SUCTION TUBE FRAZIER 12FR DISP (TUBING) ×1 IMPLANT
SUT MNCRL AB 4-0 PS2 18 (SUTURE) ×3 IMPLANT
SUT VIC AB 1 CT1 36 (SUTURE) ×3 IMPLANT
SUT VIC AB 2-0 CT1 27 (SUTURE) ×9
SUT VIC AB 2-0 CT1 TAPERPNT 27 (SUTURE) ×3 IMPLANT
SUT VLOC 180 0 24IN GS25 (SUTURE) ×3 IMPLANT
SYR 50ML LL SCALE MARK (SYRINGE) ×1 IMPLANT
TRAY FOLEY W/METER SILVER 14FR (SET/KITS/TRAYS/PACK) ×3 IMPLANT
TRAY FOLEY W/METER SILVER 16FR (SET/KITS/TRAYS/PACK) ×1 IMPLANT
WATER STERILE IRR 1500ML POUR (IV SOLUTION) ×3 IMPLANT
WRAP KNEE MAXI GEL POST OP (GAUZE/BANDAGES/DRESSINGS) ×3 IMPLANT
YANKAUER SUCT BULB TIP 10FT TU (MISCELLANEOUS) ×3 IMPLANT

## 2015-07-10 NOTE — Anesthesia Preprocedure Evaluation (Addendum)
Anesthesia Evaluation  Patient identified by MRN, date of birth, ID band Patient awake    Reviewed: Allergy & Precautions, NPO status , Patient's Chart, lab work & pertinent test results  Airway Mallampati: II  TM Distance: >3 FB Neck ROM: Full    Dental no notable dental hx.  Upper front veneers:   Pulmonary neg pulmonary ROS,    Pulmonary exam normal breath sounds clear to auscultation       Cardiovascular hypertension, Pt. on medications Normal cardiovascular exam Rhythm:Regular Rate:Normal     Neuro/Psych negative neurological ROS  negative psych ROS   GI/Hepatic negative GI ROS, Neg liver ROS,   Endo/Other  negative endocrine ROS  Renal/GU negative Renal ROS  negative genitourinary   Musculoskeletal negative musculoskeletal ROS (+)   Abdominal   Peds negative pediatric ROS (+)  Hematology negative hematology ROS (+)   Anesthesia Other Findings   Reproductive/Obstetrics negative OB ROS                            Anesthesia Physical Anesthesia Plan  ASA: II  Anesthesia Plan: Spinal   Post-op Pain Management:    Induction:   Airway Management Planned: Simple Face Mask  Additional Equipment:   Intra-op Plan:   Post-operative Plan:   Informed Consent: I have reviewed the patients History and Physical, chart, labs and discussed the procedure including the risks, benefits and alternatives for the proposed anesthesia with the patient or authorized representative who has indicated his/her understanding and acceptance.   Dental advisory given  Plan Discussed with: CRNA  Anesthesia Plan Comments:         Anesthesia Quick Evaluation

## 2015-07-10 NOTE — Discharge Instructions (Signed)

## 2015-07-10 NOTE — Anesthesia Postprocedure Evaluation (Signed)
Anesthesia Post Note  Patient: Judy Lindsey  Procedure(s) Performed: Procedure(s) (LRB): LEFT TOTAL KNEE ARTHROPLASTY (Left)  Patient location during evaluation: PACU Anesthesia Type: Spinal Level of consciousness: oriented and awake and alert Pain management: pain level controlled Vital Signs Assessment: post-procedure vital signs reviewed and stable Respiratory status: spontaneous breathing, respiratory function stable and patient connected to nasal cannula oxygen Cardiovascular status: blood pressure returned to baseline and stable Postop Assessment: no headache and no backache Anesthetic complications: no    Last Vitals:  Filed Vitals:   07/10/15 1515 07/10/15 1530  BP: 126/65 138/64  Pulse: 62 51  Temp:    Resp: 13 11    Last Pain:  Filed Vitals:   07/10/15 1532  PainSc: 0-No pain            L Sensory Level: L3-Anterior knee, lower leg (07/10/15 1530) R Sensory Level: L3-Anterior knee, lower leg (07/10/15 1530)  Phillips Grout

## 2015-07-10 NOTE — Anesthesia Procedure Notes (Signed)
Spinal Patient location during procedure: OR Start time: 07/10/2015 1:06 PM End time: 07/10/2015 1:15 PM Reason for block: at surgeon's request Staffing Resident/CRNA: Anne Fu Performed by: resident/CRNA  Preanesthetic Checklist Completed: patient identified, site marked, surgical consent, pre-op evaluation, timeout performed, IV checked, risks and benefits discussed, monitors and equipment checked and at surgeon's request Spinal Block Patient position: sitting Prep: Betadine Patient monitoring: heart rate, cardiac monitor, continuous pulse ox and blood pressure Approach: midline Location: L2-3 Injection technique: single-shot Needle Needle type: Spinocan  Needle gauge: 22 G Needle length: 9 cm Assessment Sensory level: T6 Additional Notes Expiration date of kit checked and confirmed. Patient tolerated procedure well, without complications. X 2 attempt with noted clear CSF return. Loss of motor and sensory on exam post injection, unable to obtain via right paramedian and converted to midline with noted return of CSF ease of aspiration and administration of medication.

## 2015-07-10 NOTE — Interval H&P Note (Signed)
History and Physical Interval Note:  07/10/2015 12:48 PM  Judy Lindsey  has presented today for surgery, with the diagnosis of left knee osteoarthritis  The various methods of treatment have been discussed with the patient and family. After consideration of risks, benefits and other options for treatment, the patient has consented to  Procedure(s): LEFT TOTAL KNEE ARTHROPLASTY (Left) as a surgical intervention .  The patient's history has been reviewed, patient examined, no change in status, stable for surgery.  I have reviewed the patient's chart and labs.  Questions were answered to the patient's satisfaction.     Shelda Pal

## 2015-07-10 NOTE — Transfer of Care (Signed)
Immediate Anesthesia Transfer of Care Note  Patient: Judy Lindsey  Procedure(s) Performed: Procedure(s): LEFT TOTAL KNEE ARTHROPLASTY (Left)  Patient Location: PACU  Anesthesia Type:MAC and Spinal  Level of Consciousness:  sedated, patient cooperative and responds to stimulation  Airway & Oxygen Therapy:Patient Spontanous Breathing and Patient connected to face mask oxgen  Post-op Assessment:  Report given to PACU RN and Post -op Vital signs reviewed and stable  Post vital signs:  Reviewed and stable  Last Vitals:  Filed Vitals:   07/10/15 0949  BP: 153/63  Pulse: 69  Temp: 36.5 C  Resp: 16    Complications: No apparent anesthesia complications

## 2015-07-10 NOTE — Op Note (Signed)
NAME:  Judy Lindsey                      MEDICAL RECORD NO.:  [id number]                             FACILITY:  [facility]      PHYSICIAN:  Madlyn Frankel. Charlann Boxer, M.D.  DATE OF BIRTH:  1935-07-05      DATE OF PROCEDURE:  07/10/2015                                     OPERATIVE REPORT         PREOPERATIVE DIAGNOSIS:  Left knee osteoarthritis.      POSTOPERATIVE DIAGNOSIS:  Left knee osteoarthritis.      FINDINGS:  The patient was noted to have complete loss of cartilage and   bone-on-bone arthritis with associated osteophytes in the lateral and patellofemoral compartments of   the knee with a significant synovitis and associated effusion.      PROCEDURE:  Left total knee replacement.      COMPONENTS USED:  DePuy Attune rotating platform posterior stabilized knee   system, a size 4N femur, 3 tibia, size 5 mm PS AOX insert, and 35 anatomic patellar   button.      SURGEON:  Madlyn Frankel. Charlann Boxer, M.D.      ASSISTANT:  Lanney Gins, PA-C.      ANESTHESIA:  Spinal.      SPECIMENS:  None.      COMPLICATION:  None.      DRAINS:  None  EBL: <100cc      TOURNIQUET TIME:   Total Tourniquet Time Documented: Thigh (Left) - 21 minutes Total: Thigh (Left) - 21 minutes      The patient was stable to the recovery room.      INDICATION FOR PROCEDURE:  Judy Lindsey is a 80 y.o. female patient of   mine.  The patient had been seen, evaluated, and treated conservatively in the   office with medication, activity modification, and injections.  The patient had   radiographic changes of bone-on-bone arthritis with endplate sclerosis and osteophytes noted.      The patient failed conservative measures including medication, injections, and activity modification, and at this point was ready for more definitive measures.   Based on the radiographic changes and failed conservative measures, the patient   decided to proceed with total knee replacement.  Risks of infection,   DVT, component failure, need for  revision surgery, postop course, and   expectations were all   discussed and reviewed.  Consent was obtained for benefit of pain   relief.      PROCEDURE IN DETAIL:  The patient was brought to the operative theater.   Once adequate anesthesia, preoperative antibiotics, 2 gm of Ancef, 1 gm of Tranexamic Acid, and 10 mg of Decadron administered, the patient was positioned supine with the left thigh tourniquet placed.  The  left lower extremity was prepped and draped in sterile fashion.  A time-   out was performed identifying the patient, planned procedure, and   extremity.      The left lower extremity was placed in the [employer] leg holder.  The leg was   exsanguinated, tourniquet elevated to 250 mmHg.  A midline incision was   made followed by  median parapatellar arthrotomy.  Following initial   exposure, attention was first directed to the patella.  Precut   measurement was noted to be 21 mm.  I resected down to 13 mm and used a   35 anatomic patellar button to restore patellar height as well as cover the cut   surface.      The lug holes were drilled and a metal shim was placed to protect the   patella from retractors and saw blades.      At this point, attention was now directed to the femur.  The femoral   canal was opened with a drill, irrigated to try to prevent fat emboli.  An   intramedullary rod was passed at 3 degrees valgus, 8 mm of bone was   resected off the distal femur.  Following this resection, the tibia was   subluxated anteriorly.  Using the extramedullary guide, 2 mm of bone was resected off   the proximal lateral tibia.  We confirmed the gap would be   stable medially and laterally with a size 5 mm insert as well as confirmed   the cut was perpendicular in the coronal plane, checking with an alignment rod.      Once this was done, I sized the femur to be a size 4 in the anterior-   posterior dimension, chose a narrow component based on medial and   lateral  dimension.  The size 4 rotation block was then pinned in   position anterior referenced using the C-clamp to set rotation.  The   anterior, posterior, and  chamfer cuts were made without difficulty nor   notching making certain that I was along the anterior cortex to help   with flexion gap stability.      The final box cut was made off the lateral aspect of distal femur.      At this point, the tibia was sized to be a size 3, the size 3 tray was   then pinned in position through the medial third of the tubercle,   drilled, and keel punched.  Trial reduction was now carried with a 4 femur,  3 tibia, a size 5 mm insert, and the 35 patella botton.  The knee was brought to   extension, full extension with good flexion stability with the patella   tracking through the trochlea without application of pressure.  Given   all these findings the femoral lug holes were drilled and then the trial components removed.  Final components were   opened and cement was mixed.  The knee was irrigated with normal saline   solution and pulse lavage.  The synovial lining was   then injected with 30 cc of 0.25% Marcaine with epinephrine and 1 cc of Toradol plus 30 cc of NS for a    total of 61 cc.      The knee was irrigated.  Final implants were then cemented onto clean and   dried cut surfaces of bone with the knee brought to extension with a size 5 mm trial insert.      Once the cement had fully cured, the excess cement was removed   throughout the knee.  I confirmed I was satisfied with the range of   motion and stability, and the final size 5 mm PS AOX insert was chosen.  It was   placed into the knee.      The tourniquet had been let down at 21 minutes.  No significant   hemostasis required.  The   extensor mechanism was then reapproximated using #1 Vicryl and #0 V-lock sutures with the knee   in flexion.  The   remaining wound was closed with 2-0 Vicryl and running 4-0 Monocryl.   The knee was  cleaned, dried, dressed sterilely using Dermabond and   Aquacel dressing.  The patient was then   brought to recovery room in stable condition, tolerating the procedure   well.   Please note that Physician Assistant, Lanney Gins, PA-C, was present for the entirety of the case, and was utilized for pre-operative positioning, peri-operative retractor management, general facilitation of the procedure.  He was also utilized for primary wound closure at the end of the case.              Madlyn Frankel Charlann Boxer, M.D.    07/10/2015 2:23 PM

## 2015-07-10 NOTE — Progress Notes (Signed)
Utilization review completed.  

## 2015-07-11 DIAGNOSIS — E663 Overweight: Secondary | ICD-10-CM | POA: Diagnosis present

## 2015-07-11 LAB — CBC
HEMATOCRIT: 33.8 % — AB (ref 36.0–46.0)
Hemoglobin: 11 g/dL — ABNORMAL LOW (ref 12.0–15.0)
MCH: 29.2 pg (ref 26.0–34.0)
MCHC: 32.5 g/dL (ref 30.0–36.0)
MCV: 89.7 fL (ref 78.0–100.0)
PLATELETS: 201 10*3/uL (ref 150–400)
RBC: 3.77 MIL/uL — ABNORMAL LOW (ref 3.87–5.11)
RDW: 13.2 % (ref 11.5–15.5)
WBC: 10.9 10*3/uL — AB (ref 4.0–10.5)

## 2015-07-11 LAB — BASIC METABOLIC PANEL
Anion gap: 7 (ref 5–15)
BUN: 11 mg/dL (ref 6–20)
CALCIUM: 8.1 mg/dL — AB (ref 8.9–10.3)
CO2: 25 mmol/L (ref 22–32)
CREATININE: 0.48 mg/dL (ref 0.44–1.00)
Chloride: 105 mmol/L (ref 101–111)
GFR calc Af Amer: >60 mL/min (ref >60)
GLUCOSE: 151 mg/dL — AB (ref 65–99)
Potassium: 4.7 mmol/L (ref 3.5–5.1)
Sodium: 137 mmol/L (ref 135–145)

## 2015-07-11 MED ORDER — GLYCOPYRROLATE 0.2 MG/ML IJ SOLN
INTRAMUSCULAR | Status: AC
  Filled 2015-07-11: qty 1

## 2015-07-11 MED ORDER — CEFAZOLIN SODIUM-DEXTROSE 2-3 GM-% IV SOLR
2.0000 g | Freq: Once | INTRAVENOUS | Status: AC
Start: 2015-07-11 — End: 2015-07-11
  Administered 2015-07-11: 2 g via INTRAVENOUS
  Filled 2015-07-11: qty 50

## 2015-07-11 MED ORDER — KETOROLAC TROMETHAMINE 15 MG/ML IJ SOLN
15.0000 mg | Freq: Four times a day (QID) | INTRAMUSCULAR | Status: AC
  Administered 2015-07-11 – 2015-07-12 (×8): 15 mg via INTRAVENOUS
  Filled 2015-07-11 (×9): qty 1

## 2015-07-11 MED ORDER — LIDOCAINE HCL (CARDIAC) 20 MG/ML IV SOLN
INTRAVENOUS | Status: AC
  Filled 2015-07-11: qty 5

## 2015-07-11 MED ORDER — OXYCODONE HCL 5 MG PO TABS
5.0000 mg | ORAL_TABLET | ORAL | Status: DC
  Administered 2015-07-11 (×2): 10 mg via ORAL
  Administered 2015-07-11: 15 mg via ORAL
  Administered 2015-07-11 – 2015-07-12 (×3): 10 mg via ORAL
  Administered 2015-07-12: 15 mg via ORAL
  Administered 2015-07-12: 10 mg via ORAL
  Administered 2015-07-12: 5 mg via ORAL
  Administered 2015-07-12 (×2): 10 mg via ORAL
  Administered 2015-07-13: 15 mg via ORAL
  Administered 2015-07-13 (×2): 5 mg via ORAL
  Administered 2015-07-13: 15 mg via ORAL
  Filled 2015-07-11: qty 2
  Filled 2015-07-11 (×4): qty 3
  Filled 2015-07-11: qty 2
  Filled 2015-07-11: qty 3
  Filled 2015-07-11: qty 2
  Filled 2015-07-11 (×2): qty 3
  Filled 2015-07-11 (×2): qty 2
  Filled 2015-07-11: qty 1
  Filled 2015-07-11: qty 3
  Filled 2015-07-11: qty 2

## 2015-07-11 MED ORDER — ACETAMINOPHEN 500 MG PO TABS
1000.0000 mg | ORAL_TABLET | Freq: Three times a day (TID) | ORAL | Status: DC
  Administered 2015-07-11 – 2015-07-13 (×6): 1000 mg via ORAL
  Filled 2015-07-11 (×11): qty 2

## 2015-07-11 MED ORDER — SUGAMMADEX SODIUM 500 MG/5ML IV SOLN
INTRAVENOUS | Status: AC
  Filled 2015-07-11: qty 5

## 2015-07-11 MED ORDER — ONDANSETRON HCL 4 MG/2ML IJ SOLN
INTRAMUSCULAR | Status: AC
  Filled 2015-07-11: qty 2

## 2015-07-11 MED ORDER — ROCURONIUM BROMIDE 100 MG/10ML IV SOLN
INTRAVENOUS | Status: AC
  Filled 2015-07-11: qty 1

## 2015-07-11 NOTE — Progress Notes (Signed)
[facility]  Delivered rw and commode to hospital room  Judy Lindsey 07/11/2015, 10:20 AM

## 2015-07-11 NOTE — Progress Notes (Signed)
Physical Therapy Treatment Patient Details Name: Judy Lindsey MRN: [id number] DOB: June 24, 1935 Today's Date: 07/11/2015    History of Present Illness s/p L TKA    PT Comments    Marked improvement in activity tolerance.    Follow Up Recommendations  Home health PT     Equipment Recommendations  Rolling walker with 5" wheels    Recommendations for Other Services OT consult     Precautions / Restrictions Precautions Precautions: Knee;Fall Restrictions Weight Bearing Restrictions: No Other Position/Activity Restrictions: WBAT    Mobility  Bed Mobility Overal bed mobility: Needs Assistance Bed Mobility: Supine to Sit;Sit to Supine     Supine to sit: Min assist Sit to supine: Min assist   General bed mobility comments: cues for sequence and use or R LE to self assist  Transfers Overall transfer level: Needs assistance Equipment used: Rolling walker (2 wheeled) Transfers: Sit to/from Stand Sit to Stand: Min assist         General transfer comment: cues for UE/LE placement  Ambulation/Gait Ambulation/Gait assistance: Min assist Ambulation Distance (Feet): 60 Feet Assistive device: Rolling walker (2 wheeled) Gait Pattern/deviations: Step-to pattern;Decreased step length - right;Decreased step length - left;Shuffle;Trunk flexed Gait velocity: decr   General Gait Details: cues for posture, position from RW and sequence   Stairs            Wheelchair Mobility    Modified Rankin (Stroke Patients Only)       Balance                                    Cognition Arousal/Alertness: Awake/alert Behavior During Therapy: WFL for tasks assessed/performed Overall Cognitive Status: Within Functional Limits for tasks assessed                      Exercises Total Joint Exercises Ankle Circles/Pumps: AROM;Both;15 reps;Supine Quad Sets: AROM;Both;10 reps;Supine Heel Slides: AAROM;Left;15 reps;Supine Straight Leg Raises: AAROM;Left;10  reps;Supine    General Comments        Pertinent Vitals/Pain Pain Assessment: 0-10 Pain Score: 4  Pain Location: L knee Pain Descriptors / Indicators: Aching;Sore Pain Intervention(s): Limited activity within patient's tolerance;Monitored during session;Premedicated before session;Repositioned;Ice applied    Home Living Family/patient expects to be discharged to:: Private residence Living Arrangements: Spouse/significant other Available Help at Discharge: Family Type of Home: House Home Access: Level entry   Home Layout: One level   Additional Comments: 3;1 and RW was delivered to room,    Prior Function Level of Independence: Independent          PT Goals (current goals can now be found in the care plan section) Acute Rehab PT Goals Patient Stated Goal: decreased pain; back to being independent PT Goal Formulation: With patient Time For Goal Achievement: 07/14/15 Potential to Achieve Goals: Good Progress towards PT goals: Progressing toward goals    Frequency  7X/week    PT Plan Current plan remains appropriate    Co-evaluation             End of Session Equipment Utilized During Treatment: Gait belt Activity Tolerance: Patient tolerated treatment well Patient left: in bed;with call bell/phone within reach;with family/visitor present     Time: 1510-1545 PT Time Calculation (min) (ACUTE ONLY): 35 min  Charges:  $Gait Training: 23-37 mins $Therapeutic Exercise: 8-22 mins  G Codes:      Judy Lindsey 29-Jul-2015, 4:26 PM

## 2015-07-11 NOTE — Care Management Note (Signed)
Case Management Note  Patient Details  Name: RENNY GUNNARSON MRN: [id number] Date of Birth: 1936/05/04  Subjective/Objective:                  L TKA Action/Plan: Discharge planning Expected Discharge Date:  07/12/15               Expected Discharge Plan:  [place]  In-House Referral:     Discharge planning Services  CM Consult  Post Acute Care Choice:    Choice offered to:  Patient  DME Arranged:  3-N-1, Walker rolling DME Agency:  [place]:  PT [place]:  [place]  Status of Service:  Completed, signed off  Medicare Important Message Given:    Date Medicare IM Given:    Medicare IM give by:    Date Additional Medicare IM Given:    Additional Medicare Important Message give by:     If discussed at [place] of Stay Meetings, dates discussed:    Additional Comments: CM met with pt in room to offer choice of home health agency.  Pt chooses [facility] to render HHPT.  Referral called to [facility], Tim.  CM called [facility] DME rep, Lecretia to please deliver the rolling walker and 3n1 to room prior to discharge.  No other CM needs were communicated. Dellie Catholic, RN 07/11/2015, 1:55 PM

## 2015-07-11 NOTE — Progress Notes (Signed)
CSW met with pt / PA this am to assist with d/c planning. Pt would like to d/c home with [facility] , if possible. CSW will review PT recommendations and assist with rehab placement, if required.   Werner Lean LCSW [phone]

## 2015-07-11 NOTE — Evaluation (Signed)
Physical Therapy Evaluation Patient Details Name: Judy Lindsey MRN: [id number] DOB: [birth year] Today's Date: 07/11/2015   History of Present Illness  s/p L TKA  Clinical Impression  Pt s/p L TKR presents with decreased L LE strength/ROM and post op pain and nausea limiting functional mobility.  Pt should progress to dc home with family assist and HHPT follow up.    Follow Up Recommendations Home health PT    Equipment Recommendations  Rolling walker with 5" wheels    Recommendations for Other Services OT consult     Precautions / Restrictions Precautions Precautions: Knee;Fall Restrictions Weight Bearing Restrictions: No Other Position/Activity Restrictions: WBAT      Mobility  Bed Mobility Overal bed mobility: Needs Assistance Bed Mobility: Supine to Sit     Supine to sit: Min assist     General bed mobility comments: cues for sequence and use or R LE to self assist  Transfers Overall transfer level: Needs assistance Equipment used: Rolling walker (2 wheeled) Transfers: Sit to/from Stand Sit to Stand: Min assist         General transfer comment: cues for UE/LE placement  Ambulation/Gait Ambulation/Gait assistance: Min assist Ambulation Distance (Feet): 10 Feet Assistive device: Rolling walker (2 wheeled) Gait Pattern/deviations: Step-to pattern;Decreased step length - right;Decreased step length - left;Shuffle;Trunk flexed Gait velocity: secr   General Gait Details: cues for posture, position from RW and sequence; distance ltd by nausea  Stairs            Wheelchair Mobility    Modified Rankin (Stroke Patients Only)       Balance                                             Pertinent Vitals/Pain Pain Assessment: 0-10 Pain Score: 4  Pain Location: L knee Pain Descriptors / Indicators: Aching;Sore Pain Intervention(s): Limited activity within patient's tolerance;Monitored during session;Premedicated before session;Ice  applied    Home Living Family/patient expects to be discharged to:: Private residence Living Arrangements: Spouse/significant other Available Help at Discharge: Family Type of Home: House Home Access: Level entry     Home Layout: One level   Additional Comments: 3;1 and RW was delivered to room,    Prior Function Level of Independence: Independent               Hand Dominance        Extremity/Trunk Assessment   Upper Extremity Assessment: Overall WFL for tasks assessed           Lower Extremity Assessment: LLE deficits/detail   LLE Deficits / Details: 3-/5 quads with AAROM at knee -10-  50  Cervical / Trunk Assessment: Normal  Communication   Communication: No difficulties  Cognition Arousal/Alertness: Awake/alert Behavior During Therapy: WFL for tasks assessed/performed Overall Cognitive Status: Within Functional Limits for tasks assessed                      General Comments      Exercises Total Joint Exercises Ankle Circles/Pumps: AROM;Both;15 reps;Supine Quad Sets: AROM;Both;10 reps;Supine Heel Slides: AAROM;Left;15 reps;Supine Straight Leg Raises: AAROM;Left;10 reps;Supine      Assessment/Plan    PT Assessment Patient needs continued PT services  PT Diagnosis Difficulty walking   PT Problem List Decreased strength;Decreased range of motion;Decreased activity tolerance;Decreased mobility;Decreased knowledge of use of DME;Pain  PT Treatment Interventions DME instruction;Gait  training;Stair training;Functional mobility training;Therapeutic activities;Therapeutic exercise;Patient/family education   PT Goals (Current goals can be found in the Care Plan section) Acute Rehab PT Goals Patient Stated Goal: decreased pain; back to being independent PT Goal Formulation: With patient Time For Goal Achievement: 07/14/15 Potential to Achieve Goals: Good    Frequency 7X/week   Barriers to discharge        Co-evaluation                End of Session Equipment Utilized During Treatment: Gait belt Activity Tolerance: Other (comment) (nausea) Patient left: in chair;with call bell/phone within reach;with family/visitor present Nurse Communication: Mobility status         Time: 4098-1191 PT Time Calculation (min) (ACUTE ONLY): 35 min   Charges:   PT Evaluation $PT Eval Low Complexity: 1 Procedure PT Treatments $Therapeutic Exercise: 8-22 mins   PT G Codes:        Lety Cullens 08/07/15, 4:21 PM

## 2015-07-11 NOTE — Evaluation (Signed)
Occupational Therapy Evaluation Patient Details Name: Judy Lindsey MRN: [id number] DOB: 18-Oct-1935 Today's Date: 07/11/2015    History of Present Illness s/p L TKA   Clinical Impression   This 80 year old female was admitted for the above surgery. Her husband will assist her with adls as needed at home. Will follow in acute with min guard level goals for bathroom transfers.    Follow Up Recommendations  No OT follow up;Supervision/Assistance - 24 hour    Equipment Recommendations  3 in 1 bedside comode (delivered)    Recommendations for Other Services       Precautions / Restrictions Precautions Precautions: Knee;Fall Restrictions Weight Bearing Restrictions: No      Mobility Bed Mobility               General bed mobility comments: pt at EOB with PT  Transfers Overall transfer level: Needs assistance Equipment used: Rolling walker (2 wheeled) Transfers: Sit to/from Stand Sit to Stand: Min assist         General transfer comment: cues for UE/LE placement    Balance                                            ADL Overall ADL's : Needs assistance/impaired                         Toilet Transfer: Minimal assistance;Ambulation (chair)             General ADL Comments: pt is able to perform UB adls with set up and LB adls with min A.  Pt states that husband will help as needed; she is not interested in AE. She was limited by stomach feeling queasy after walking to door     Vision     Perception     Praxis      Pertinent Vitals/Pain Pain Assessment: 0-10 Pain Score: 4  Pain Location: L knee Pain Descriptors / Indicators: Aching Pain Intervention(s): Limited activity within patient's tolerance;Monitored during session;Premedicated before session;Repositioned;Ice applied     Hand Dominance     Extremity/Trunk Assessment Upper Extremity Assessment Upper Extremity Assessment: Overall WFL for tasks assessed            Communication Communication Communication: No difficulties   Cognition Arousal/Alertness: Awake/alert Behavior During Therapy: WFL for tasks assessed/performed Overall Cognitive Status: Within Functional Limits for tasks assessed                     General Comments       Exercises       Shoulder Instructions      Home Living Family/patient expects to be discharged to:: Private residence Living Arrangements: Spouse/significant other                 Bathroom Shower/Tub: Producer, television/film/video: Standard         Additional Comments: 3;1 was delivered to room,      Prior Functioning/Environment Level of Independence: Independent             OT Diagnosis: Generalized weakness   OT Problem List: Decreased strength;Decreased activity tolerance;Decreased knowledge of use of DME or AE;Pain   OT Treatment/Interventions: Self-care/ADL training;DME and/or AE instruction;Patient/family education    OT Goals(Current goals can be found in the care plan section) Acute Rehab OT Goals  Patient Stated Goal: decreased pain; back to being independentn OT Goal Formulation: With patient Time For Goal Achievement: 07/18/15 Potential to Achieve Goals: Good ADL Goals Pt Will Transfer to Toilet: with min guard assist;ambulating;bedside commode Pt Will Perform Tub/Shower Transfer: Shower transfer;with min guard assist;ambulating;3 in 1  OT Frequency: Min 2X/week   Barriers to D/C:            Co-evaluation              End of Session    Activity Tolerance:  (limited by stomach feeling queasy) Patient left: in chair;with call bell/phone within reach;with family/visitor present   Time: 4098-1191 OT Time Calculation (min): 16 min Charges:  OT General Charges $OT Visit: 1 Procedure OT Evaluation $OT Eval Low Complexity: 1 Procedure G-Codes:    Rhodes Calvert Jul 31, 2015, 12:25 PM  Marica Otter, OTR/L [phone] Jul 31, 2015

## 2015-07-11 NOTE — Progress Notes (Signed)
     Subjective: 1 Day Post-Op Procedure(s) (LRB): LEFT TOTAL KNEE ARTHROPLASTY (Left)   Patient reports pain as moderate, not fully controlled on Norco.  Changed Norco to Oxycodone. She states that she did not sleep well and pain issues throughout the night.  States that she would rather go home   Objective:   VITALS:   Filed Vitals:   07/11/15 0209 07/11/15 0614  BP: 104/47 122/47  Pulse: 60 55  Temp: 97.5 F (36.4 C) 97.5 F (36.4 C)  Resp: 14 16    Dorsiflexion/Plantar flexion intact Incision: dressing C/D/I No cellulitis present Compartment soft  LABS  Recent Labs  07/11/15 0339  HGB 11.0*  HCT 33.8*  WBC 10.9*  PLT 201     Recent Labs  07/11/15 0339  NA 137  K 4.7  BUN 11  CREATININE 0.48  GLUCOSE 151*     Assessment/Plan: 1 Day Post-Op Procedure(s) (LRB): LEFT TOTAL KNEE ARTHROPLASTY (Left) Foley cath d/c'ed Advance diet Up with therapy D/C IV fluids Discharge home with home health eventually, when ready  Overweight (BMI 25-29.9) Estimated body mass index is 26.22 kg/(m^2) as calculated from the following:   Height as of this encounter:  (1.6 m).   Weight as of this encounter: 67.132 kg (148 lb). Patient also counseled that weight may inhibit the healing process Patient counseled that losing weight will help with future health issues     Anastasio Auerbach. Judy Lindsey   PAC  07/11/2015, 9:00 AM

## 2015-07-12 LAB — CBC
HCT: 31.2 % — ABNORMAL LOW (ref 36.0–46.0)
Hemoglobin: 10 g/dL — ABNORMAL LOW (ref 12.0–15.0)
MCH: 28.7 pg (ref 26.0–34.0)
MCHC: 32.1 g/dL (ref 30.0–36.0)
MCV: 89.7 fL (ref 78.0–100.0)
PLATELETS: 241 10*3/uL (ref 150–400)
RBC: 3.48 MIL/uL — ABNORMAL LOW (ref 3.87–5.11)
RDW: 13.4 % (ref 11.5–15.5)
WBC: 11.1 10*3/uL — AB (ref 4.0–10.5)

## 2015-07-12 LAB — URINALYSIS, ROUTINE W REFLEX MICROSCOPIC
BILIRUBIN URINE: NEGATIVE
GLUCOSE, UA: NEGATIVE mg/dL
Hgb urine dipstick: NEGATIVE
KETONES UR: NEGATIVE mg/dL
LEUKOCYTES UA: NEGATIVE
NITRITE: NEGATIVE
PROTEIN: NEGATIVE mg/dL
Specific Gravity, Urine: 1.011 (ref 1.005–1.030)
pH: 6.5 (ref 5.0–8.0)

## 2015-07-12 LAB — BASIC METABOLIC PANEL
ANION GAP: 7 (ref 5–15)
BUN: 12 mg/dL (ref 6–20)
CALCIUM: 8.6 mg/dL — AB (ref 8.9–10.3)
CO2: 26 mmol/L (ref 22–32)
Chloride: 102 mmol/L (ref 101–111)
Creatinine, Ser: 0.6 mg/dL (ref 0.44–1.00)
Glucose, Bld: 110 mg/dL — ABNORMAL HIGH (ref 65–99)
Potassium: 4.7 mmol/L (ref 3.5–5.1)
SODIUM: 135 mmol/L (ref 135–145)

## 2015-07-12 MED ORDER — FERROUS SULFATE 325 (65 FE) MG PO TABS: 325.0000 mg | ORAL_TABLET | Freq: Three times a day (TID) | ORAL | Status: DC

## 2015-07-12 MED ORDER — DOCUSATE SODIUM 100 MG PO CAPS: 100.0000 mg | ORAL_CAPSULE | Freq: Two times a day (BID) | ORAL | Status: DC

## 2015-07-12 MED ORDER — TIZANIDINE HCL 4 MG PO TABS: 4.0000 mg | ORAL_TABLET | Freq: Four times a day (QID) | ORAL | Status: DC | PRN

## 2015-07-12 MED ORDER — POLYETHYLENE GLYCOL 3350 17 G PO PACK: 17.0000 g | PACK | Freq: Two times a day (BID) | ORAL | Status: DC

## 2015-07-12 MED ORDER — ACETAMINOPHEN 500 MG PO TABS: 1000.0000 mg | ORAL_TABLET | Freq: Three times a day (TID) | ORAL | Status: DC

## 2015-07-12 MED ORDER — ASPIRIN 325 MG PO TBEC: 325.0000 mg | DELAYED_RELEASE_TABLET | Freq: Two times a day (BID) | ORAL | Status: DC

## 2015-07-12 MED ORDER — OXYCODONE HCL 5 MG PO TABS: 5.0000 mg | ORAL_TABLET | ORAL | Status: DC | PRN

## 2015-07-12 NOTE — Progress Notes (Signed)
     Subjective: 2 Days Post-Op Procedure(s) (LRB): LEFT TOTAL KNEE ARTHROPLASTY (Left)   Patient reports pain as mild, pain controlled.  Feels that the pain is much better controlled today.  Did not sleep well, but otherwise no events throughout the night.  Starting to feel some burning with urinating.  Objective:   VITALS:   Filed Vitals:   07/11/15 1951 07/12/15 0635  BP: 117/60 126/81  Pulse: 62 57  Temp: 97.5 F (36.4 C) 98.2 F (36.8 C)  Resp: 15     Dorsiflexion/Plantar flexion intact Incision: dressing C/D/I No cellulitis present Compartment soft  LABS  Recent Labs  07/11/15 0339 07/12/15 0412  HGB 11.0* 10.0*  HCT 33.8* 31.2*  WBC 10.9* 11.1*  PLT 201 241     Recent Labs  07/11/15 0339 07/12/15 0412  NA 137 135  K 4.7 4.7  BUN 11 12  CREATININE 0.48 0.60  GLUCOSE 151* 110*     Assessment/Plan: 2 Days Post-Op Procedure(s) (LRB): LEFT TOTAL KNEE ARTHROPLASTY (Left) Obtain UA to evaluate for UTI Up with therapy Discharge home with home health eventually when ready, possible tomorrow  Overweight (BMI 25-29.9) Estimated body mass index is 26.22 kg/(m^2) as calculated from the following:   Height as of this encounter:  (1.6 m).   Weight as of this encounter: 67.132 kg (148 lb). Patient also counseled that weight may inhibit the healing process Patient counseled that losing weight will help with future health issues        Anastasio Auerbach. Daniel Johndrow   PAC  07/12/2015, 7:53 AM

## 2015-07-12 NOTE — Progress Notes (Signed)
Physical Therapy Treatment Patient Details Name: Judy Lindsey MRN: [id number] DOB: [birth year] Today's Date: 08/10/2015    History of Present Illness s/p L TKA    PT Comments    Pt progressing steadily with mobility and states more confident in dc home tomorrow.  Follow Up Recommendations  Home health PT     Equipment Recommendations  Rolling walker with 5" wheels    Recommendations for Other Services OT consult     Precautions / Restrictions Precautions Precautions: Knee;Fall Restrictions Weight Bearing Restrictions: No Other Position/Activity Restrictions: WBAT    Mobility  Bed Mobility Overal bed mobility: Needs Assistance Bed Mobility: Supine to Sit;Sit to Supine     Supine to sit: Min guard Sit to supine: Min guard   General bed mobility comments: cues for sequence and use of R LE to self assist  Transfers Overall transfer level: Needs assistance Equipment used: Rolling walker (2 wheeled) Transfers: Sit to/from Stand Sit to Stand: Supervision         General transfer comment: cues for UE placement   Ambulation/Gait Ambulation/Gait assistance: Min guard;Supervision Ambulation Distance (Feet): 147 Feet Assistive device: Rolling walker (2 wheeled) Gait Pattern/deviations: Step-to pattern;Decreased step length - right;Decreased step length - left;Shuffle;Trunk flexed Gait velocity: decr   General Gait Details: min cues for posture, position from RW and sequence   Stairs            Wheelchair Mobility    Modified Rankin (Stroke Patients Only)       Balance                                    Cognition Arousal/Alertness: Awake/alert Behavior During Therapy: WFL for tasks assessed/performed Overall Cognitive Status: Within Functional Limits for tasks assessed                      Exercises      General Comments        Pertinent Vitals/Pain Pain Assessment: 0-10 Pain Score: 4  Pain Location: L knee Pain  Descriptors / Indicators: Aching;Sore Pain Intervention(s): Limited activity within patient's tolerance;Monitored during session;Premedicated before session;Ice applied    Home Living                      Prior Function            PT Goals (current goals can now be found in the care plan section) Acute Rehab PT Goals Patient Stated Goal: decreased pain; back to being independent PT Goal Formulation: With patient Time For Goal Achievement: 07/14/15 Potential to Achieve Goals: Good Progress towards PT goals: Progressing toward goals    Frequency  7X/week    PT Plan Current plan remains appropriate    Co-evaluation             End of Session Equipment Utilized During Treatment: Gait belt Activity Tolerance: Patient tolerated treatment well Patient left: in bed;with call bell/phone within reach;with family/visitor present     Time: 4098-1191 PT Time Calculation (min) (ACUTE ONLY): 20 min  Charges:  $Gait Training: 8-22 mins                    G Codes:      Jovi Alvizo 08-10-2015, 3:22 PM

## 2015-07-12 NOTE — Progress Notes (Signed)
Physical Therapy Treatment Patient Details Name: Judy Lindsey MRN: [id number] DOB: 04-16-1936 Today's Date: 07/12/2015    History of Present Illness s/p L TKA    PT Comments    Good progress with mobility  Follow Up Recommendations  Home health PT     Equipment Recommendations  Rolling walker with 5" wheels    Recommendations for Other Services OT consult     Precautions / Restrictions Precautions Precautions: Knee;Fall Restrictions Weight Bearing Restrictions: No Other Position/Activity Restrictions: WBAT    Mobility  Bed Mobility Overal bed mobility: Needs Assistance Bed Mobility: Supine to Sit;Sit to Supine     Supine to sit: Min assist Sit to supine: Min assist   General bed mobility comments: cues for sequence and use of R LE to self assist  Transfers Overall transfer level: Needs assistance Equipment used: Rolling walker (2 wheeled) Transfers: Sit to/from Stand Sit to Stand: Supervision         General transfer comment: cues for UE placement   Ambulation/Gait Ambulation/Gait assistance: Min guard Ambulation Distance (Feet): 111 Feet (and 20') Assistive device: Rolling walker (2 wheeled) Gait Pattern/deviations: Step-to pattern;Decreased step length - left;Decreased step length - right;Shuffle;Trunk flexed Gait velocity: decr   General Gait Details: cues for posture, position from RW and sequence   Stairs Stairs: Yes Stairs assistance: Min assist Stair Management: No rails;Backwards;Step to pattern;With walker Number of Stairs: 2 General stair comments: single step twice with cues for sequenc  Wheelchair Mobility    Modified Rankin (Stroke Patients Only)       Balance                                    Cognition Arousal/Alertness: Awake/alert Behavior During Therapy: WFL for tasks assessed/performed Overall Cognitive Status: Within Functional Limits for tasks assessed                      Exercises Total  Joint Exercises Ankle Circles/Pumps: AROM;Both;15 reps;Supine Quad Sets: AROM;Both;Supine;15 reps Heel Slides: AAROM;Left;15 reps;Supine Straight Leg Raises: Left;Supine;20 reps;AAROM;AROM    General Comments        Pertinent Vitals/Pain Pain Assessment: 0-10 Pain Score: 5  Pain Location: L knee Pain Descriptors / Indicators: Aching;Sore Pain Intervention(s): Limited activity within patient's tolerance;Monitored during session;Premedicated before session;Ice applied    Home Living                      Prior Function            PT Goals (current goals can now be found in the care plan section) Acute Rehab PT Goals Patient Stated Goal: decreased pain; back to being independent PT Goal Formulation: With patient Time For Goal Achievement: 07/14/15 Potential to Achieve Goals: Good Progress towards PT goals: Progressing toward goals    Frequency  7X/week    PT Plan Current plan remains appropriate    Co-evaluation             End of Session Equipment Utilized During Treatment: Gait belt Activity Tolerance: Patient tolerated treatment well Patient left: in chair;with call bell/phone within reach;with family/visitor present     Time: 4098-1191 PT Time Calculation (min) (ACUTE ONLY): 38 min  Charges:  $Gait Training: 23-37 mins $Therapeutic Exercise: 8-22 mins                    G Codes:  Alexys Lobello 07/12/2015, 12:48 PM

## 2015-07-12 NOTE — Progress Notes (Signed)
Occupational Therapy Treatment Patient Details Name: Judy Lindsey MRN: [id number] DOB: [birth year] Today's Date: 07/12/2015    History of present illness s/p L TKA   OT comments  Goals met this session.  Pt/husband have no further questions for OT  Follow Up Recommendations  No OT follow up;Supervision/Assistance - 24 hour    Equipment Recommendations  3 in 1 bedside comode    Recommendations for Other Services      Precautions / Restrictions Precautions Precautions: Knee;Fall Restrictions Weight Bearing Restrictions: No Other Position/Activity Restrictions: WBAT       Mobility Bed Mobility               General bed mobility comments: oob  Transfers   Equipment used: Rolling walker (2 wheeled)   Sit to Stand: Supervision         General transfer comment: cues for UE placement     Balance                                   ADL Overall ADL's : Needs assistance/impaired     Grooming: Oral care;Supervision/safety;Standing                   Toilet Transfer: Supervision/safety;Ambulation;BSC;RW   Toileting- Clothing Manipulation and Hygiene: Supervision/safety;Sit to/from stand   Tub/ Shower Transfer: Walk-in shower;Min guard;Ambulation     General ADL Comments: Pt able to perform toilet transfer at supervision level; however, therapist placed trash can under L foot for comfort and removed this prior to her standing up      Vision                     Perception     Praxis      Cognition   Behavior During Therapy: [facility] for tasks assessed/performed Overall Cognitive Status: Within Functional Limits for tasks assessed                       Extremity/Trunk Assessment               Exercises     Shoulder Instructions       General Comments      Pertinent Vitals/ Pain       Pain Score: 4  Pain Location: L knee Pain Descriptors / Indicators: Aching Pain Intervention(s): Limited activity within  patient's tolerance;Monitored during session;Repositioned;Patient requesting pain meds-RN notified  Home Living                                          Prior Functioning/Environment              Frequency       Progress Toward Goals  OT Goals(current goals can now be found in the care plan section)  Progress towards OT goals: Goals met/education completed, patient discharged from OT  Acute Rehab OT Goals Patient Stated Goal: decreased pain; back to being independent  Plan      Co-evaluation                 End of Session     Activity Tolerance Patient tolerated treatment well   Patient Left in chair;with call bell/phone within reach;with family/visitor present   Nurse Communication          Time: 5329-9242 OT  Time Calculation (min): 21 min  Charges: OT General Charges $OT Visit: 1 Procedure OT Treatments $Self Care/Home Management : 8-22 mins  Judy Lindsey 07/12/2015, 10:51 AM  Judy Lindsey, OTR/L [phone] 07/12/2015

## 2015-07-13 NOTE — Progress Notes (Signed)
Physical Therapy Treatment Patient Details Name: Judy Lindsey MRN: [id number] DOB: 1936/03/20 Today's Date: 07/13/2015    History of Present Illness s/p L TKA    PT Comments    Pt progressing steadily with mobility but ltd this am by fatigue/pain.  Reviewed stair and therex with pt and spouse.  Follow Up Recommendations  Home health PT     Equipment Recommendations  Rolling walker with 5" wheels    Recommendations for Other Services OT consult     Precautions / Restrictions Precautions Precautions: Knee;Fall Restrictions Weight Bearing Restrictions: No Other Position/Activity Restrictions: WBAT    Mobility  Bed Mobility Overal bed mobility: Needs Assistance Bed Mobility: Supine to Sit;Sit to Supine     Supine to sit: Min guard Sit to supine: Min assist   General bed mobility comments: cues for sequence and use of R LE to self assist.  Increased assist back to bed 2* fatigue  Transfers Overall transfer level: Needs assistance Equipment used: Rolling walker (2 wheeled) Transfers: Sit to/from Stand Sit to Stand: Supervision         General transfer comment: cues for UE placement   Ambulation/Gait Ambulation/Gait assistance: Min guard;Supervision Ambulation Distance (Feet): 68 Feet (and 15' into bathroom) Assistive device: Rolling walker (2 wheeled) Gait Pattern/deviations: Step-to pattern;Decreased step length - right;Decreased step length - left;Shuffle;Trunk flexed Gait velocity: decr   General Gait Details: min cues for posture, position from RW and sequence   Stairs Stairs: Yes Stairs assistance: Min assist Stair Management: No rails;Step to pattern;Backwards;With walker Number of Stairs: 1 General stair comments: cues for sequence  Wheelchair Mobility    Modified Rankin (Stroke Patients Only)       Balance                                    Cognition Arousal/Alertness: Awake/alert Behavior During Therapy: WFL for tasks  assessed/performed Overall Cognitive Status: Within Functional Limits for tasks assessed                      Exercises Total Joint Exercises Ankle Circles/Pumps: AROM;Both;15 reps;Supine Quad Sets: AROM;Both;Supine;10 reps Heel Slides: AAROM;Left;Supine;10 reps Straight Leg Raises: Left;Supine;AAROM;10 reps Goniometric ROM: AAROM at L knee -10 - 50    General Comments        Pertinent Vitals/Pain Pain Assessment: 0-10 Pain Score: 5  Pain Location: L knee Pain Descriptors / Indicators: Aching;Sore Pain Intervention(s): Limited activity within patient's tolerance;Monitored during session;Premedicated before session;Ice applied    Home Living                      Prior Function            PT Goals (current goals can now be found in the care plan section) Acute Rehab PT Goals Patient Stated Goal: decreased pain; back to being independent PT Goal Formulation: With patient Time For Goal Achievement: 07/14/15 Potential to Achieve Goals: Good Progress towards PT goals: Progressing toward goals    Frequency  7X/week    PT Plan Current plan remains appropriate    Co-evaluation             End of Session Equipment Utilized During Treatment: Gait belt Activity Tolerance: Patient limited by fatigue;Patient limited by pain;Patient tolerated treatment well Patient left: in bed;with call bell/phone within reach;with family/visitor present     Time: 1020-1056 PT Time Calculation (min) (ACUTE  ONLY): 36 min  Charges:  $Gait Training: 8-22 mins $Therapeutic Exercise: 8-22 mins                    G Codes:      Judy Lindsey 07/13/2015, 12:57 PM

## 2015-07-13 NOTE — Progress Notes (Signed)
     Subjective: 3 Days Post-Op Procedure(s) (LRB): LEFT TOTAL KNEE ARTHROPLASTY (Left)   Patient reports pain as mild, pain controlled. No events throughout the night.  Feels that she is progressing with PT much better. Feels that she can better handle activities at home. Ready to be discharged home.   Objective:   VITALS:   Filed Vitals:   07/12/15 2248 07/13/15 0524  BP: 125/51 141/71  Pulse: 63 73  Temp: 98.2 F (36.8 C) 98.7 F (37.1 C)  Resp: 16 16    Dorsiflexion/Plantar flexion intact Incision: dressing C/D/I No cellulitis present Compartment soft  LABS  Recent Labs  07/11/15 0339 07/12/15 0412  HGB 11.0* 10.0*  HCT 33.8* 31.2*  WBC 10.9* 11.1*  PLT 201 241     Recent Labs  07/11/15 0339 07/12/15 0412  NA 137 135  K 4.7 4.7  BUN 11 12  CREATININE 0.48 0.60  GLUCOSE 151* 110*     Assessment/Plan: 3 Days Post-Op Procedure(s) (LRB): LEFT TOTAL KNEE ARTHROPLASTY (Left) Up with therapy Discharge home with home health  [facility] Follow up with OLIN,Delonda Coley D in 2 weeks.  Contact information:  [facility] [address] [place] [id number] [phone]        Anastasio AuerbachMatthew S. Nephi Savage   PAC  07/13/2015, 7:17 AM

## 2015-07-14 DIAGNOSIS — E663 Overweight: Secondary | ICD-10-CM | POA: Diagnosis not present

## 2015-07-14 DIAGNOSIS — Z6826 Body mass index (BMI) 26.0-26.9, adult: Secondary | ICD-10-CM | POA: Diagnosis not present

## 2015-07-14 DIAGNOSIS — Z96652 Presence of left artificial knee joint: Secondary | ICD-10-CM | POA: Diagnosis not present

## 2015-07-14 DIAGNOSIS — I10 Essential (primary) hypertension: Secondary | ICD-10-CM | POA: Diagnosis not present

## 2015-07-14 DIAGNOSIS — Z471 Aftercare following joint replacement surgery: Secondary | ICD-10-CM | POA: Diagnosis not present

## 2015-07-14 DIAGNOSIS — M1990 Unspecified osteoarthritis, unspecified site: Secondary | ICD-10-CM | POA: Diagnosis not present

## 2015-07-16 DIAGNOSIS — Z6826 Body mass index (BMI) 26.0-26.9, adult: Secondary | ICD-10-CM | POA: Diagnosis not present

## 2015-07-16 DIAGNOSIS — I10 Essential (primary) hypertension: Secondary | ICD-10-CM | POA: Diagnosis not present

## 2015-07-16 DIAGNOSIS — M1990 Unspecified osteoarthritis, unspecified site: Secondary | ICD-10-CM | POA: Diagnosis not present

## 2015-07-16 DIAGNOSIS — Z471 Aftercare following joint replacement surgery: Secondary | ICD-10-CM | POA: Diagnosis not present

## 2015-07-16 DIAGNOSIS — E663 Overweight: Secondary | ICD-10-CM | POA: Diagnosis not present

## 2015-07-16 DIAGNOSIS — Z96652 Presence of left artificial knee joint: Secondary | ICD-10-CM | POA: Diagnosis not present

## 2015-07-16 NOTE — Discharge Summary (Signed)
Physician Discharge Summary  Patient ID: Judy Lindsey MRN: [id number] DOB/AGE: 01-29-1936 80 y.o.  Admit date: 07/10/2015 Discharge date: 07/13/2015   Procedures:  Procedure(s) (LRB): LEFT TOTAL KNEE ARTHROPLASTY (Left)  Attending Physician:  Dr. Durene Romans   Admission Diagnoses:   Left knee primary OA / pain  Discharge Diagnoses:  Principal Problem:   S/P left TKA Active Problems:   Overweight (BMI 25.0-29.9)  Past Medical History  Diagnosis Date  . Heart murmur   . Hypertension   . Hypothyroidism   . History of urinary tract infection   . Urinary incontinence   . Arthritis   . Hemorrhoids   . Fall     down stairs     HPI:    Judy Lindsey, 80 y.o. female, has a history of pain and functional disability in the left knee due to arthritis and has failed non-surgical conservative treatments for greater than 12 weeks to include NSAID's and/or analgesics, corticosteriod injections and activity modification. Onset of symptoms was gradual, starting 4-5 months ago with rapidlly worsening course since that time. The patient noted no past surgery on the left knee(s). Patient currently rates pain in the left knee(s) at 10 out of 10 with activity. Patient has night pain, worsening of pain with activity and weight bearing, pain that interferes with activities of daily living, pain with passive range of motion, crepitus and joint swelling. Patient has evidence of periarticular osteophytes and joint space narrowing by imaging studies. There is no active infection. Risks, benefits and expectations were discussed with the patient. Risks including but not limited to the risk of anesthesia, blood clots, nerve damage, blood vessel damage, failure of the prosthesis, infection and up to and including death. Patient understand the risks, benefits and expectations and wishes to proceed with surgery.   PCP: Astrid Divine, MD   Discharged Condition: good  Hospital Course:  Patient  underwent the above stated procedure on 07/10/2015. Patient tolerated the procedure well and brought to the recovery room in good condition and subsequently to the floor.  POD #1 BP: 122/47 ; Pulse: 55 ; Temp: 97.5 F (36.4 C) ; Resp: 16 Patient reports pain as moderate, not fully controlled on Norco. Changed Norco to Oxycodone. She states that she did not sleep well and pain issues throughout the night. Dorsiflexion/plantar flexion intact, incision: dressing C/D/I, no cellulitis present and compartment soft.   LABS  Basename    HGB     11.0  HCT     33.8   POD #2  BP: 126/81 ; Pulse: 57 ; Temp: 98.2 F (36.8 C) ; Resp: 15 Patient reports pain as mild, pain controlled. Feels that the pain is much better controlled today. Did not sleep well, but otherwise no events throughout the night. Dorsiflexion/plantar flexion intact, incision: dressing C/D/I, no cellulitis present and compartment soft.   LABS  Basename    HGB     10.0  HCT     31.2   POD #3  BP: 141/71 ; Pulse: 73 ; Temp: 98.7 F (37.1 C) ; Resp: 16 Patient reports pain as mild, pain controlled. No events throughout the night. Feels that she is progressing with PT much better. Feels that she can better handle activities at home. Ready to be discharged home.  Dorsiflexion/plantar flexion intact, incision: dressing C/D/I, no cellulitis present and compartment soft.   LABS   No new labs   Discharge Exam: General appearance: alert, cooperative and no distress Extremities: Homans sign is  negative, no sign of DVT, no edema, redness or tenderness in the calves or thighs and no ulcers, gangrene or trophic changes  Disposition: Home with follow up in 2 weeks   Follow-up Information    Follow up with Shelda Pal, MD. Schedule an appointment as soon as possible for a visit in 2 weeks.   Specialty:  Orthopedic Surgery   Contact information:   [address] [place] [address] [phone]       Follow  up with [facility].   Why:  home health physical therapy   Contact information:   [address] [place] [address] [phone]       Discharge Instructions    Call MD / Call 911    Complete by:  As directed   If you experience chest pain or shortness of breath, CALL 911 and be transported to the hospital emergency room.  If you develope a fever above 101 F, pus (white drainage) or increased drainage or redness at the wound, or calf pain, call your surgeon's office.     Change dressing    Complete by:  As directed   Maintain surgical dressing until follow up in the clinic. If the edges start to pull up, may reinforce with tape. If the dressing is no longer working, may remove and cover with gauze and tape, but must keep the area dry and clean.  Call with any questions or concerns.     Constipation Prevention    Complete by:  As directed   Drink plenty of fluids.  Prune juice may be helpful.  You may use a stool softener, such as Colace (over the counter) 100 mg twice a day.  Use MiraLax (over the counter) for constipation as needed.     Diet - low sodium heart healthy    Complete by:  As directed      Discharge instructions    Complete by:  As directed   Maintain surgical dressing until follow up in the clinic. If the edges start to pull up, may reinforce with tape. If the dressing is no longer working, may remove and cover with gauze and tape, but must keep the area dry and clean.  Follow up in 2 weeks at [facility]. Call with any questions or concerns.     Increase activity slowly as tolerated    Complete by:  As directed   Weight bearing as tolerated with assist device (walker, cane, etc) as directed, use it as long as suggested by your surgeon or therapist, typically at least 4-6 weeks.     TED hose    Complete by:  As directed   Use stockings (TED hose) for 2 weeks on both leg(s).  You may remove them at night for sleeping.               Medication List    STOP taking these medications        traMADol 50 MG tablet  Commonly known as:  ULTRAM      TAKE these medications        acetaminophen 500 MG tablet  Commonly known as:  TYLENOL  Take 2 tablets (1,000 mg total) by mouth every 8 (eight) hours.     aspirin 325 MG EC tablet  Take 1 tablet (325 mg total) by mouth 2 (two) times daily.     benazepril 10 MG tablet  Commonly known as:  LOTENSIN  Take 10 mg by  mouth daily.     betamethasone dipropionate 0.05 % cream  Commonly known as:  DIPROLENE  Apply 1 application topically daily as needed (eczema).     celecoxib 200 MG capsule  Commonly known as:  CELEBREX  Take 200mg  by mouth once daily if needed for pain     Coenzyme Q10 100 MG Tabs  Take 100 mg by mouth daily.     docusate sodium 100 MG capsule  Commonly known as:  COLACE  Take 1 capsule (100 mg total) by mouth 2 (two) times daily.     ferrous sulfate 325 (65 FE) MG tablet  Take 1 tablet (325 mg total) by mouth 3 (three) times daily after meals.     oxyCODONE 5 MG immediate release tablet  Commonly known as:  Oxy IR/ROXICODONE  Take 1-3 tablets (5-15 mg total) by mouth every 4 (four) hours as needed for severe pain.     polyethylene glycol packet  Commonly known as:  MIRALAX / GLYCOLAX  Take 17 g by mouth 2 (two) times daily.     rosuvastatin 10 MG tablet  Commonly known as:  CRESTOR  Take 10 mg by mouth daily.     SYNTHROID 100 MCG tablet  Generic drug:  levothyroxine  Take 100 mcg by mouth daily before breakfast.     tiZANidine 4 MG tablet  Commonly known as:  ZANAFLEX  Take 1 tablet (4 mg total) by mouth every 6 (six) hours as needed for muscle spasms.     Vitamin D (Ergocalciferol) 50000 units Caps capsule  Commonly known as:  DRISDOL  TAKLE 50000units BY MOUTH TWICE A WEEK ON SUNDAYS AND WEDNESDAYS.     vitamin E 400 UNIT capsule  Take 400 Units by mouth daily.         Signed: Anastasio AuerbachMatthew S. Anushri Casalino   PA-C  07/16/2015, 10:22  AM

## 2015-07-18 DIAGNOSIS — Z96652 Presence of left artificial knee joint: Secondary | ICD-10-CM | POA: Diagnosis not present

## 2015-07-18 DIAGNOSIS — Z471 Aftercare following joint replacement surgery: Secondary | ICD-10-CM | POA: Diagnosis not present

## 2015-07-18 DIAGNOSIS — Z6826 Body mass index (BMI) 26.0-26.9, adult: Secondary | ICD-10-CM | POA: Diagnosis not present

## 2015-07-18 DIAGNOSIS — M1990 Unspecified osteoarthritis, unspecified site: Secondary | ICD-10-CM | POA: Diagnosis not present

## 2015-07-18 DIAGNOSIS — E663 Overweight: Secondary | ICD-10-CM | POA: Diagnosis not present

## 2015-07-18 DIAGNOSIS — I10 Essential (primary) hypertension: Secondary | ICD-10-CM | POA: Diagnosis not present

## 2015-07-20 DIAGNOSIS — Z6826 Body mass index (BMI) 26.0-26.9, adult: Secondary | ICD-10-CM | POA: Diagnosis not present

## 2015-07-20 DIAGNOSIS — E663 Overweight: Secondary | ICD-10-CM | POA: Diagnosis not present

## 2015-07-20 DIAGNOSIS — M1990 Unspecified osteoarthritis, unspecified site: Secondary | ICD-10-CM | POA: Diagnosis not present

## 2015-07-20 DIAGNOSIS — Z96652 Presence of left artificial knee joint: Secondary | ICD-10-CM | POA: Diagnosis not present

## 2015-07-20 DIAGNOSIS — Z471 Aftercare following joint replacement surgery: Secondary | ICD-10-CM | POA: Diagnosis not present

## 2015-07-20 DIAGNOSIS — I10 Essential (primary) hypertension: Secondary | ICD-10-CM | POA: Diagnosis not present

## 2015-07-23 DIAGNOSIS — Z96652 Presence of left artificial knee joint: Secondary | ICD-10-CM | POA: Diagnosis not present

## 2015-07-23 DIAGNOSIS — M1990 Unspecified osteoarthritis, unspecified site: Secondary | ICD-10-CM | POA: Diagnosis not present

## 2015-07-23 DIAGNOSIS — Z6826 Body mass index (BMI) 26.0-26.9, adult: Secondary | ICD-10-CM | POA: Diagnosis not present

## 2015-07-23 DIAGNOSIS — E663 Overweight: Secondary | ICD-10-CM | POA: Diagnosis not present

## 2015-07-23 DIAGNOSIS — I10 Essential (primary) hypertension: Secondary | ICD-10-CM | POA: Diagnosis not present

## 2015-07-23 DIAGNOSIS — Z471 Aftercare following joint replacement surgery: Secondary | ICD-10-CM | POA: Diagnosis not present

## 2015-07-25 DIAGNOSIS — I10 Essential (primary) hypertension: Secondary | ICD-10-CM | POA: Diagnosis not present

## 2015-07-25 DIAGNOSIS — E663 Overweight: Secondary | ICD-10-CM | POA: Diagnosis not present

## 2015-07-25 DIAGNOSIS — Z96652 Presence of left artificial knee joint: Secondary | ICD-10-CM | POA: Diagnosis not present

## 2015-07-25 DIAGNOSIS — Z6826 Body mass index (BMI) 26.0-26.9, adult: Secondary | ICD-10-CM | POA: Diagnosis not present

## 2015-07-25 DIAGNOSIS — Z471 Aftercare following joint replacement surgery: Secondary | ICD-10-CM | POA: Diagnosis not present

## 2015-07-25 DIAGNOSIS — M1990 Unspecified osteoarthritis, unspecified site: Secondary | ICD-10-CM | POA: Diagnosis not present

## 2015-07-28 DIAGNOSIS — Z6826 Body mass index (BMI) 26.0-26.9, adult: Secondary | ICD-10-CM | POA: Diagnosis not present

## 2015-07-28 DIAGNOSIS — I10 Essential (primary) hypertension: Secondary | ICD-10-CM | POA: Diagnosis not present

## 2015-07-28 DIAGNOSIS — M1990 Unspecified osteoarthritis, unspecified site: Secondary | ICD-10-CM | POA: Diagnosis not present

## 2015-07-28 DIAGNOSIS — Z96652 Presence of left artificial knee joint: Secondary | ICD-10-CM | POA: Diagnosis not present

## 2015-07-28 DIAGNOSIS — E663 Overweight: Secondary | ICD-10-CM | POA: Diagnosis not present

## 2015-07-28 DIAGNOSIS — Z471 Aftercare following joint replacement surgery: Secondary | ICD-10-CM | POA: Diagnosis not present

## 2015-07-31 DIAGNOSIS — M1711 Unilateral primary osteoarthritis, right knee: Secondary | ICD-10-CM | POA: Diagnosis not present

## 2015-08-02 DIAGNOSIS — M1711 Unilateral primary osteoarthritis, right knee: Secondary | ICD-10-CM | POA: Diagnosis not present

## 2015-08-06 DIAGNOSIS — M1711 Unilateral primary osteoarthritis, right knee: Secondary | ICD-10-CM | POA: Diagnosis not present

## 2015-08-08 DIAGNOSIS — M1711 Unilateral primary osteoarthritis, right knee: Secondary | ICD-10-CM | POA: Diagnosis not present

## 2015-08-10 DIAGNOSIS — M1711 Unilateral primary osteoarthritis, right knee: Secondary | ICD-10-CM | POA: Diagnosis not present

## 2015-08-13 DIAGNOSIS — M1711 Unilateral primary osteoarthritis, right knee: Secondary | ICD-10-CM | POA: Diagnosis not present

## 2015-08-15 DIAGNOSIS — M1711 Unilateral primary osteoarthritis, right knee: Secondary | ICD-10-CM | POA: Diagnosis not present

## 2015-08-17 DIAGNOSIS — M1711 Unilateral primary osteoarthritis, right knee: Secondary | ICD-10-CM | POA: Diagnosis not present

## 2015-08-20 DIAGNOSIS — M1711 Unilateral primary osteoarthritis, right knee: Secondary | ICD-10-CM | POA: Diagnosis not present

## 2015-08-22 DIAGNOSIS — M1711 Unilateral primary osteoarthritis, right knee: Secondary | ICD-10-CM | POA: Diagnosis not present

## 2015-08-27 DIAGNOSIS — M1711 Unilateral primary osteoarthritis, right knee: Secondary | ICD-10-CM | POA: Diagnosis not present

## 2015-08-29 DIAGNOSIS — M4802 Spinal stenosis, cervical region: Secondary | ICD-10-CM | POA: Diagnosis not present

## 2015-08-29 DIAGNOSIS — Z0000 Encounter for general adult medical examination without abnormal findings: Secondary | ICD-10-CM | POA: Diagnosis not present

## 2015-08-29 DIAGNOSIS — Z96652 Presence of left artificial knee joint: Secondary | ICD-10-CM | POA: Diagnosis not present

## 2015-08-29 DIAGNOSIS — M1990 Unspecified osteoarthritis, unspecified site: Secondary | ICD-10-CM | POA: Diagnosis not present

## 2015-08-29 DIAGNOSIS — E7800 Pure hypercholesterolemia, unspecified: Secondary | ICD-10-CM | POA: Diagnosis not present

## 2015-08-29 DIAGNOSIS — I10 Essential (primary) hypertension: Secondary | ICD-10-CM | POA: Diagnosis not present

## 2015-08-29 DIAGNOSIS — E559 Vitamin D deficiency, unspecified: Secondary | ICD-10-CM | POA: Diagnosis not present

## 2015-08-29 DIAGNOSIS — E039 Hypothyroidism, unspecified: Secondary | ICD-10-CM | POA: Diagnosis not present

## 2015-08-29 DIAGNOSIS — M1712 Unilateral primary osteoarthritis, left knee: Secondary | ICD-10-CM | POA: Diagnosis not present

## 2015-08-29 DIAGNOSIS — M810 Age-related osteoporosis without current pathological fracture: Secondary | ICD-10-CM | POA: Diagnosis not present

## 2015-08-29 DIAGNOSIS — Z471 Aftercare following joint replacement surgery: Secondary | ICD-10-CM | POA: Diagnosis not present

## 2015-08-29 DIAGNOSIS — E041 Nontoxic single thyroid nodule: Secondary | ICD-10-CM | POA: Diagnosis not present

## 2015-08-31 ENCOUNTER — Other Ambulatory Visit: Payer: Self-pay | Admitting: Family Medicine

## 2015-08-31 DIAGNOSIS — R200 Anesthesia of skin: Secondary | ICD-10-CM

## 2015-08-31 DIAGNOSIS — R531 Weakness: Secondary | ICD-10-CM

## 2015-08-31 DIAGNOSIS — M542 Cervicalgia: Secondary | ICD-10-CM

## 2015-09-08 ENCOUNTER — Ambulatory Visit
Admission: RE | Admit: 2015-09-08 | Discharge: 2015-09-08 | Disposition: A | Discharge status: Home / Self Care | Payer: Medicare PPO | Source: Ambulatory Visit | Attending: Family Medicine | Admitting: Family Medicine

## 2015-09-08 DIAGNOSIS — M50221 Other cervical disc displacement at C4-C5 level: Secondary | ICD-10-CM | POA: Diagnosis not present

## 2015-09-08 DIAGNOSIS — R200 Anesthesia of skin: Secondary | ICD-10-CM

## 2015-09-08 DIAGNOSIS — R531 Weakness: Secondary | ICD-10-CM

## 2015-09-08 DIAGNOSIS — M50222 Other cervical disc displacement at C5-C6 level: Secondary | ICD-10-CM | POA: Diagnosis not present

## 2015-09-08 DIAGNOSIS — M542 Cervicalgia: Secondary | ICD-10-CM

## 2015-09-14 DIAGNOSIS — Z1231 Encounter for screening mammogram for malignant neoplasm of breast: Secondary | ICD-10-CM | POA: Diagnosis not present

## 2015-09-20 DIAGNOSIS — Z6825 Body mass index (BMI) 25.0-25.9, adult: Secondary | ICD-10-CM | POA: Diagnosis not present

## 2015-09-20 DIAGNOSIS — M542 Cervicalgia: Secondary | ICD-10-CM | POA: Diagnosis not present

## 2015-09-20 DIAGNOSIS — M5021 Other cervical disc displacement,  high cervical region: Secondary | ICD-10-CM | POA: Diagnosis not present

## 2015-09-27 DIAGNOSIS — Z6825 Body mass index (BMI) 25.0-25.9, adult: Secondary | ICD-10-CM | POA: Diagnosis not present

## 2015-09-27 DIAGNOSIS — M542 Cervicalgia: Secondary | ICD-10-CM | POA: Diagnosis not present

## 2015-09-27 DIAGNOSIS — M5021 Other cervical disc displacement,  high cervical region: Secondary | ICD-10-CM | POA: Diagnosis not present

## 2015-10-30 DIAGNOSIS — M542 Cervicalgia: Secondary | ICD-10-CM | POA: Diagnosis not present

## 2015-10-30 DIAGNOSIS — M5021 Other cervical disc displacement,  high cervical region: Secondary | ICD-10-CM | POA: Diagnosis not present

## 2016-02-04 DIAGNOSIS — H04222 Epiphora due to insufficient drainage, left lacrimal gland: Secondary | ICD-10-CM | POA: Diagnosis not present

## 2016-02-04 DIAGNOSIS — H02535 Eyelid retraction left lower eyelid: Secondary | ICD-10-CM | POA: Diagnosis not present

## 2016-02-04 DIAGNOSIS — H04221 Epiphora due to insufficient drainage, right lacrimal gland: Secondary | ICD-10-CM | POA: Diagnosis not present

## 2016-02-04 DIAGNOSIS — H11433 Conjunctival hyperemia, bilateral: Secondary | ICD-10-CM | POA: Diagnosis not present

## 2016-02-04 DIAGNOSIS — H02532 Eyelid retraction right lower eyelid: Secondary | ICD-10-CM | POA: Diagnosis not present

## 2016-02-18 DIAGNOSIS — H02135 Senile ectropion of left lower eyelid: Secondary | ICD-10-CM | POA: Diagnosis not present

## 2016-02-18 DIAGNOSIS — H02535 Eyelid retraction left lower eyelid: Secondary | ICD-10-CM | POA: Diagnosis not present

## 2016-03-03 DIAGNOSIS — H02532 Eyelid retraction right lower eyelid: Secondary | ICD-10-CM | POA: Diagnosis not present

## 2016-03-03 DIAGNOSIS — H0289 Other specified disorders of eyelid: Secondary | ICD-10-CM | POA: Diagnosis not present

## 2016-03-03 DIAGNOSIS — H02132 Senile ectropion of right lower eyelid: Secondary | ICD-10-CM | POA: Diagnosis not present

## 2016-03-12 DIAGNOSIS — M4802 Spinal stenosis, cervical region: Secondary | ICD-10-CM | POA: Diagnosis not present

## 2016-03-12 DIAGNOSIS — E7800 Pure hypercholesterolemia, unspecified: Secondary | ICD-10-CM | POA: Diagnosis not present

## 2016-03-12 DIAGNOSIS — E039 Hypothyroidism, unspecified: Secondary | ICD-10-CM | POA: Diagnosis not present

## 2016-03-12 DIAGNOSIS — I10 Essential (primary) hypertension: Secondary | ICD-10-CM | POA: Diagnosis not present

## 2016-03-12 DIAGNOSIS — Z23 Encounter for immunization: Secondary | ICD-10-CM | POA: Diagnosis not present

## 2016-03-25 DIAGNOSIS — J069 Acute upper respiratory infection, unspecified: Secondary | ICD-10-CM | POA: Diagnosis not present

## 2016-03-25 DIAGNOSIS — R300 Dysuria: Secondary | ICD-10-CM | POA: Diagnosis not present

## 2016-06-10 DIAGNOSIS — H524 Presbyopia: Secondary | ICD-10-CM | POA: Diagnosis not present

## 2016-06-10 DIAGNOSIS — H5203 Hypermetropia, bilateral: Secondary | ICD-10-CM | POA: Diagnosis not present

## 2016-06-10 DIAGNOSIS — Z961 Presence of intraocular lens: Secondary | ICD-10-CM | POA: Diagnosis not present

## 2016-06-19 DIAGNOSIS — Z09 Encounter for follow-up examination after completed treatment for conditions other than malignant neoplasm: Secondary | ICD-10-CM | POA: Diagnosis not present

## 2016-06-19 DIAGNOSIS — H02532 Eyelid retraction right lower eyelid: Secondary | ICD-10-CM | POA: Diagnosis not present

## 2016-06-19 DIAGNOSIS — H02535 Eyelid retraction left lower eyelid: Secondary | ICD-10-CM | POA: Diagnosis not present

## 2016-08-15 DIAGNOSIS — Z96652 Presence of left artificial knee joint: Secondary | ICD-10-CM | POA: Diagnosis not present

## 2016-08-15 DIAGNOSIS — Z471 Aftercare following joint replacement surgery: Secondary | ICD-10-CM | POA: Diagnosis not present

## 2016-09-15 DIAGNOSIS — J45909 Unspecified asthma, uncomplicated: Secondary | ICD-10-CM | POA: Diagnosis not present

## 2016-09-30 DIAGNOSIS — R300 Dysuria: Secondary | ICD-10-CM | POA: Diagnosis not present

## 2016-11-14 DIAGNOSIS — Z1231 Encounter for screening mammogram for malignant neoplasm of breast: Secondary | ICD-10-CM | POA: Diagnosis not present

## 2016-11-21 DIAGNOSIS — M810 Age-related osteoporosis without current pathological fracture: Secondary | ICD-10-CM | POA: Diagnosis not present

## 2016-11-21 DIAGNOSIS — Z0000 Encounter for general adult medical examination without abnormal findings: Secondary | ICD-10-CM | POA: Diagnosis not present

## 2016-11-21 DIAGNOSIS — E039 Hypothyroidism, unspecified: Secondary | ICD-10-CM | POA: Diagnosis not present

## 2016-11-21 DIAGNOSIS — E559 Vitamin D deficiency, unspecified: Secondary | ICD-10-CM | POA: Diagnosis not present

## 2016-11-21 DIAGNOSIS — E7800 Pure hypercholesterolemia, unspecified: Secondary | ICD-10-CM | POA: Diagnosis not present

## 2016-11-21 DIAGNOSIS — E041 Nontoxic single thyroid nodule: Secondary | ICD-10-CM | POA: Diagnosis not present

## 2016-11-21 DIAGNOSIS — M4802 Spinal stenosis, cervical region: Secondary | ICD-10-CM | POA: Diagnosis not present

## 2016-11-21 DIAGNOSIS — M1990 Unspecified osteoarthritis, unspecified site: Secondary | ICD-10-CM | POA: Diagnosis not present

## 2016-11-21 DIAGNOSIS — I10 Essential (primary) hypertension: Secondary | ICD-10-CM | POA: Diagnosis not present

## 2016-11-25 DIAGNOSIS — M8589 Other specified disorders of bone density and structure, multiple sites: Secondary | ICD-10-CM | POA: Diagnosis not present

## 2016-12-17 DIAGNOSIS — R112 Nausea with vomiting, unspecified: Secondary | ICD-10-CM | POA: Diagnosis not present

## 2016-12-17 DIAGNOSIS — R197 Diarrhea, unspecified: Secondary | ICD-10-CM | POA: Diagnosis not present

## 2016-12-17 DIAGNOSIS — R300 Dysuria: Secondary | ICD-10-CM | POA: Diagnosis not present

## 2016-12-19 ENCOUNTER — Inpatient Hospital Stay (HOSPITAL_COMMUNITY)
Admission: EM | Admit: 2016-12-19 | Discharge: 2016-12-23 | DRG: 871 | Disposition: A | Discharge status: Home / Self Care | Payer: Medicare PPO | Attending: Internal Medicine | Admitting: Internal Medicine

## 2016-12-19 ENCOUNTER — Emergency Department (HOSPITAL_COMMUNITY): Payer: Medicare PPO

## 2016-12-19 ENCOUNTER — Encounter (HOSPITAL_COMMUNITY): Payer: Self-pay | Admitting: Emergency Medicine

## 2016-12-19 DIAGNOSIS — R112 Nausea with vomiting, unspecified: Secondary | ICD-10-CM | POA: Diagnosis not present

## 2016-12-19 DIAGNOSIS — R197 Diarrhea, unspecified: Secondary | ICD-10-CM | POA: Diagnosis not present

## 2016-12-19 DIAGNOSIS — Z9049 Acquired absence of other specified parts of digestive tract: Secondary | ICD-10-CM

## 2016-12-19 DIAGNOSIS — G4700 Insomnia, unspecified: Secondary | ICD-10-CM | POA: Diagnosis present

## 2016-12-19 DIAGNOSIS — M1990 Unspecified osteoarthritis, unspecified site: Secondary | ICD-10-CM | POA: Diagnosis not present

## 2016-12-19 DIAGNOSIS — Z90710 Acquired absence of both cervix and uterus: Secondary | ICD-10-CM

## 2016-12-19 DIAGNOSIS — E785 Hyperlipidemia, unspecified: Secondary | ICD-10-CM | POA: Diagnosis present

## 2016-12-19 DIAGNOSIS — Z885 Allergy status to narcotic agent status: Secondary | ICD-10-CM | POA: Diagnosis not present

## 2016-12-19 DIAGNOSIS — M179 Osteoarthritis of knee, unspecified: Secondary | ICD-10-CM | POA: Diagnosis not present

## 2016-12-19 DIAGNOSIS — Z96652 Presence of left artificial knee joint: Secondary | ICD-10-CM | POA: Diagnosis present

## 2016-12-19 DIAGNOSIS — J9621 Acute and chronic respiratory failure with hypoxia: Secondary | ICD-10-CM

## 2016-12-19 DIAGNOSIS — I10 Essential (primary) hypertension: Secondary | ICD-10-CM | POA: Diagnosis not present

## 2016-12-19 DIAGNOSIS — Z8249 Family history of ischemic heart disease and other diseases of the circulatory system: Secondary | ICD-10-CM | POA: Diagnosis not present

## 2016-12-19 DIAGNOSIS — E871 Hypo-osmolality and hyponatremia: Secondary | ICD-10-CM | POA: Diagnosis present

## 2016-12-19 DIAGNOSIS — Z79899 Other long term (current) drug therapy: Secondary | ICD-10-CM

## 2016-12-19 DIAGNOSIS — N760 Acute vaginitis: Secondary | ICD-10-CM | POA: Diagnosis present

## 2016-12-19 DIAGNOSIS — R109 Unspecified abdominal pain: Secondary | ICD-10-CM | POA: Diagnosis not present

## 2016-12-19 DIAGNOSIS — Z888 Allergy status to other drugs, medicaments and biological substances status: Secondary | ICD-10-CM

## 2016-12-19 DIAGNOSIS — E559 Vitamin D deficiency, unspecified: Secondary | ICD-10-CM | POA: Diagnosis present

## 2016-12-19 DIAGNOSIS — J9601 Acute respiratory failure with hypoxia: Secondary | ICD-10-CM | POA: Diagnosis not present

## 2016-12-19 DIAGNOSIS — R0682 Tachypnea, not elsewhere classified: Secondary | ICD-10-CM | POA: Diagnosis present

## 2016-12-19 DIAGNOSIS — A419 Sepsis, unspecified organism: Principal | ICD-10-CM | POA: Diagnosis present

## 2016-12-19 DIAGNOSIS — A4151 Sepsis due to Escherichia coli [E. coli]: Secondary | ICD-10-CM | POA: Diagnosis not present

## 2016-12-19 DIAGNOSIS — E039 Hypothyroidism, unspecified: Secondary | ICD-10-CM | POA: Diagnosis present

## 2016-12-19 DIAGNOSIS — Z87440 Personal history of urinary (tract) infections: Secondary | ICD-10-CM

## 2016-12-19 DIAGNOSIS — R531 Weakness: Secondary | ICD-10-CM | POA: Diagnosis not present

## 2016-12-19 DIAGNOSIS — Z7982 Long term (current) use of aspirin: Secondary | ICD-10-CM | POA: Diagnosis not present

## 2016-12-19 DIAGNOSIS — R011 Cardiac murmur, unspecified: Secondary | ICD-10-CM | POA: Diagnosis present

## 2016-12-19 DIAGNOSIS — J181 Lobar pneumonia, unspecified organism: Secondary | ICD-10-CM

## 2016-12-19 DIAGNOSIS — R110 Nausea: Secondary | ICD-10-CM | POA: Diagnosis not present

## 2016-12-19 DIAGNOSIS — N761 Subacute and chronic vaginitis: Secondary | ICD-10-CM | POA: Diagnosis not present

## 2016-12-19 DIAGNOSIS — R05 Cough: Secondary | ICD-10-CM | POA: Diagnosis not present

## 2016-12-19 DIAGNOSIS — J189 Pneumonia, unspecified organism: Secondary | ICD-10-CM | POA: Diagnosis not present

## 2016-12-19 DIAGNOSIS — E876 Hypokalemia: Secondary | ICD-10-CM | POA: Diagnosis not present

## 2016-12-19 DIAGNOSIS — R1110 Vomiting, unspecified: Secondary | ICD-10-CM | POA: Diagnosis not present

## 2016-12-19 DIAGNOSIS — Z820 Family history of epilepsy and other diseases of the nervous system: Secondary | ICD-10-CM

## 2016-12-19 DIAGNOSIS — J120 Adenoviral pneumonia: Secondary | ICD-10-CM | POA: Diagnosis not present

## 2016-12-19 DIAGNOSIS — R000 Tachycardia, unspecified: Secondary | ICD-10-CM | POA: Diagnosis present

## 2016-12-19 DIAGNOSIS — R0602 Shortness of breath: Secondary | ICD-10-CM

## 2016-12-19 DIAGNOSIS — R509 Fever, unspecified: Secondary | ICD-10-CM | POA: Diagnosis not present

## 2016-12-19 LAB — C DIFFICILE QUICK SCREEN W PCR REFLEX
C Diff antigen: NEGATIVE
C Diff interpretation: NOT DETECTED
C Diff toxin: NEGATIVE

## 2016-12-19 LAB — CBC WITH DIFFERENTIAL/PLATELET
Basophils Absolute: 0 10*3/uL (ref 0.0–0.1)
Basophils Relative: 0 %
EOS ABS: 0 10*3/uL (ref 0.0–0.7)
Eosinophils Relative: 0 %
HCT: 34.8 % — ABNORMAL LOW (ref 36.0–46.0)
HEMOGLOBIN: 12.1 g/dL (ref 12.0–15.0)
LYMPHS PCT: 8 %
Lymphs Abs: 0.5 10*3/uL — ABNORMAL LOW (ref 0.7–4.0)
MCH: 30.1 pg (ref 26.0–34.0)
MCHC: 34.8 g/dL (ref 30.0–36.0)
MCV: 86.6 fL (ref 78.0–100.0)
Monocytes Absolute: 0.3 10*3/uL (ref 0.1–1.0)
Monocytes Relative: 5 %
NEUTROS ABS: 5.5 10*3/uL (ref 1.7–7.7)
NEUTROS PCT: 87 %
Platelets: 159 10*3/uL (ref 150–400)
RBC: 4.02 MIL/uL (ref 3.87–5.11)
RDW: 13.4 % (ref 11.5–15.5)
WBC: 6.3 10*3/uL (ref 4.0–10.5)

## 2016-12-19 LAB — COMPREHENSIVE METABOLIC PANEL
ALBUMIN: 3.1 g/dL — AB (ref 3.5–5.0)
ALK PHOS: 42 U/L (ref 38–126)
ALT: 15 U/L (ref 14–54)
ANION GAP: 14 (ref 5–15)
AST: 33 U/L (ref 15–41)
BUN: 18 mg/dL (ref 6–20)
CALCIUM: 8.5 mg/dL — AB (ref 8.9–10.3)
CO2: 18 mmol/L — AB (ref 22–32)
Chloride: 91 mmol/L — ABNORMAL LOW (ref 101–111)
Creatinine, Ser: 1.08 mg/dL — ABNORMAL HIGH (ref 0.44–1.00)
GFR calc Af Amer: 55 mL/min — ABNORMAL LOW (ref >60)
GFR calc non Af Amer: 48 mL/min — ABNORMAL LOW (ref >60)
GLUCOSE: 116 mg/dL — AB (ref 65–99)
Potassium: 3.7 mmol/L (ref 3.5–5.1)
SODIUM: 123 mmol/L — AB (ref 135–145)
Total Bilirubin: 1.1 mg/dL (ref 0.3–1.2)
Total Protein: 6 g/dL — ABNORMAL LOW (ref 6.5–8.1)

## 2016-12-19 LAB — URINALYSIS, ROUTINE W REFLEX MICROSCOPIC
Bilirubin Urine: NEGATIVE
GLUCOSE, UA: NEGATIVE mg/dL
Hgb urine dipstick: NEGATIVE
Ketones, ur: 20 mg/dL — AB
LEUKOCYTES UA: NEGATIVE
Nitrite: NEGATIVE
PH: 6 (ref 5.0–8.0)
Protein, ur: NEGATIVE mg/dL
Specific Gravity, Urine: 1.031 — ABNORMAL HIGH (ref 1.005–1.030)

## 2016-12-19 LAB — OSMOLALITY, URINE: Osmolality, Ur: 394 mOsm/kg (ref 300–900)

## 2016-12-19 LAB — OSMOLALITY: OSMOLALITY: 270 mosm/kg — AB (ref 275–295)

## 2016-12-19 LAB — TSH: TSH: 0.415 u[IU]/mL (ref 0.350–4.500)

## 2016-12-19 LAB — CORTISOL: CORTISOL PLASMA: 12.5 ug/dL

## 2016-12-19 LAB — SODIUM, URINE, RANDOM: Sodium, Ur: <10 mmol/L

## 2016-12-19 LAB — STREP PNEUMONIAE URINARY ANTIGEN: Strep Pneumo Urinary Antigen: NEGATIVE

## 2016-12-19 LAB — I-STAT CG4 LACTIC ACID, ED: Lactic Acid, Venous: 1.11 mmol/L (ref 0.5–1.9)

## 2016-12-19 MED ORDER — VITAMIN D (ERGOCALCIFEROL) 1.25 MG (50000 UNIT) PO CAPS
50000.0000 [IU] | ORAL_CAPSULE | ORAL | Status: DC
  Administered 2016-12-21: 50000 [IU] via ORAL
  Filled 2016-12-19: qty 1

## 2016-12-19 MED ORDER — ONDANSETRON HCL 4 MG/2ML IJ SOLN
4.0000 mg | Freq: Once | INTRAMUSCULAR | Status: AC
  Administered 2016-12-19: 4 mg via INTRAVENOUS
  Filled 2016-12-19: qty 2

## 2016-12-19 MED ORDER — IOPAMIDOL (ISOVUE-300) INJECTION 61%
INTRAVENOUS | Status: AC
  Administered 2016-12-19: 80 mL via INTRAVENOUS
  Filled 2016-12-19: qty 100

## 2016-12-19 MED ORDER — ENOXAPARIN SODIUM 40 MG/0.4ML ~~LOC~~ SOLN
40.0000 mg | SUBCUTANEOUS | Status: DC
  Administered 2016-12-19 – 2016-12-22 (×4): 40 mg via SUBCUTANEOUS
  Filled 2016-12-19 (×4): qty 0.4

## 2016-12-19 MED ORDER — PROMETHAZINE HCL 25 MG PO TABS
25.0000 mg | ORAL_TABLET | Freq: Three times a day (TID) | ORAL | Status: DC | PRN
  Administered 2016-12-20: 25 mg via ORAL
  Filled 2016-12-19: qty 1

## 2016-12-19 MED ORDER — ACETAMINOPHEN 325 MG PO TABS
650.0000 mg | ORAL_TABLET | Freq: Once | ORAL | Status: AC
  Administered 2016-12-19: 650 mg via ORAL
  Filled 2016-12-19: qty 2

## 2016-12-19 MED ORDER — LEVOFLOXACIN IN D5W 250 MG/50ML IV SOLN: 250.0000 mg | INTRAVENOUS | Status: DC

## 2016-12-19 MED ORDER — PRO-STAT SUGAR FREE PO LIQD
30.0000 mL | Freq: Two times a day (BID) | ORAL | Status: DC
  Administered 2016-12-19: 30 mL via ORAL
  Filled 2016-12-19 (×6): qty 30

## 2016-12-19 MED ORDER — TRAMADOL HCL 50 MG PO TABS
50.0000 mg | ORAL_TABLET | Freq: Four times a day (QID) | ORAL | Status: DC | PRN
  Administered 2016-12-19 – 2016-12-20 (×3): 50 mg via ORAL
  Filled 2016-12-19 (×3): qty 1

## 2016-12-19 MED ORDER — SODIUM CHLORIDE 0.9 % IV BOLUS (SEPSIS)
1000.0000 mL | Freq: Once | INTRAVENOUS | Status: AC
  Administered 2016-12-19: 1000 mL via INTRAVENOUS

## 2016-12-19 MED ORDER — PIPERACILLIN-TAZOBACTAM 3.375 G IVPB 30 MIN
3.3750 g | Freq: Once | INTRAVENOUS | Status: AC
  Administered 2016-12-19: 3.375 g via INTRAVENOUS
  Filled 2016-12-19: qty 50

## 2016-12-19 MED ORDER — SODIUM CHLORIDE 0.9 % IV SOLN
INTRAVENOUS | Status: DC
  Administered 2016-12-19 – 2016-12-20 (×2): via INTRAVENOUS

## 2016-12-19 MED ORDER — ROSUVASTATIN CALCIUM 10 MG PO TABS
10.0000 mg | ORAL_TABLET | ORAL | Status: DC
  Administered 2016-12-22: 10 mg via ORAL
  Filled 2016-12-19: qty 1

## 2016-12-19 MED ORDER — LEVOFLOXACIN IN D5W 500 MG/100ML IV SOLN
500.0000 mg | INTRAVENOUS | Status: DC
  Filled 2016-12-19: qty 100

## 2016-12-19 MED ORDER — LEVOTHYROXINE SODIUM 100 MCG PO TABS
100.0000 ug | ORAL_TABLET | Freq: Every day | ORAL | Status: DC
  Administered 2016-12-20 – 2016-12-23 (×4): 100 ug via ORAL
  Filled 2016-12-19 (×4): qty 1

## 2016-12-19 MED ORDER — POLYETHYLENE GLYCOL 3350 17 G PO PACK: 17.0000 g | PACK | Freq: Every day | ORAL | Status: DC | PRN

## 2016-12-19 MED ORDER — LEVOFLOXACIN IN D5W 500 MG/100ML IV SOLN: 500.0000 mg | INTRAVENOUS | Status: DC

## 2016-12-19 MED ORDER — ALBUTEROL SULFATE (2.5 MG/3ML) 0.083% IN NEBU
2.5000 mg | INHALATION_SOLUTION | RESPIRATORY_TRACT | Status: DC | PRN
  Administered 2016-12-20 – 2016-12-22 (×4): 2.5 mg via RESPIRATORY_TRACT
  Filled 2016-12-19 (×3): qty 3

## 2016-12-19 MED ORDER — METRONIDAZOLE 500 MG PO TABS
500.0000 mg | ORAL_TABLET | Freq: Three times a day (TID) | ORAL | Status: DC
  Administered 2016-12-19 – 2016-12-21 (×6): 500 mg via ORAL
  Filled 2016-12-19 (×6): qty 1

## 2016-12-19 MED ORDER — CELECOXIB 200 MG PO CAPS
200.0000 mg | ORAL_CAPSULE | Freq: Every day | ORAL | Status: DC | PRN
  Administered 2016-12-19: 200 mg via ORAL
  Filled 2016-12-19 (×4): qty 1

## 2016-12-19 MED ORDER — LEVOFLOXACIN IN D5W 250 MG/50ML IV SOLN
250.0000 mg | INTRAVENOUS | Status: DC
Start: 2016-12-21 — End: 2016-12-20

## 2016-12-19 MED ORDER — DEXTROSE 5 % IV SOLN
500.0000 mg | Freq: Once | INTRAVENOUS | Status: AC
  Administered 2016-12-19: 500 mg via INTRAVENOUS
  Filled 2016-12-19: qty 500

## 2016-12-19 MED ORDER — ALBUTEROL SULFATE HFA 108 (90 BASE) MCG/ACT IN AERS: 2.0000 | INHALATION_SPRAY | RESPIRATORY_TRACT | Status: DC | PRN

## 2016-12-19 NOTE — ED Notes (Signed)
Linens changed and patient cleaned.  No new diarrhea found.  Patient resting comfortably.

## 2016-12-19 NOTE — ED Notes (Signed)
Report attempted 

## 2016-12-19 NOTE — H&P (Signed)
TRH H&P   Patient Demographics:    Judy Lindsey, is a 81 y.o. female  MRN: [id number]   DOB - 29-Nov-1935  Admit Date - 12/19/2016  Outpatient Primary MD for the patient is Maurice Small, MD  Referring MD/NP/PA: Laveda Norman  Outpatient Specialists:   Durene Romans, Hilda Lias (neurosurgery)  Patient coming from: home  Chief Complaint  Patient presents with  . Fever  . Diarrhea  . Weakness      HPI:    Judy Lindsey  is a 81 y.o. female, w hypertension, hypothyroidism, apparently present from home with fever intermittent for about 1 week.  Slight cough.  Slight sob, generalized weakness. + diarrhea,  No recent travel or odd food eaten.  Tried immodium and helped some .  Pt was seen on Wednesday,  T99, and ua negative, but some  vaginal infection.   Pt presented to ED for evaluation.    In ED  Wbc 6.3,  Hgb 12.1 ,  Na 123 , CXR +> + pneumonia RLL   Pt will be admitted for pneumonia and diarrrrhea.     Review of systems:    In addition to the HPI above,  No Fever-chills, No Headache, No changes with Vision or hearing, No problems swallowing food or Liquids, No Chest pain, Cough or Shortness of Breath, No Abdominal pain, No Blood in stool or Urine, No dysuria, No new skin rashes or bruises, No new joints pains-aches,  No new weakness, tingling, numbness in any extremity, No recent weight gain or loss, No polyuria, polydypsia or polyphagia, No significant Mental Stressors.  A full 10 point Review of Systems was done, except as stated above, all other Review of Systems were negative.   With Past History of the following :    Past Medical History:  Diagnosis Date  . Arthritis   . Fall    down stairs   . Heart murmur   . Hemorrhoids   . History of urinary tract infection   . Hypertension   . Hypothyroidism   . Urinary incontinence       Past Surgical History:    Procedure Laterality Date  . ABDOMINAL HYSTERECTOMY    . ANKLE SURGERY     right had screw placed still has   . APPENDECTOMY    . arm surgery      had excess skin removed from under upper arm area bilat 05/16/2015   . CHOLECYSTECTOMY    . EYE SURGERY     cataract surgery bilat   . HEMORRHOID SURGERY    . HERNIA REPAIR    . TONSILLECTOMY    . TOTAL KNEE ARTHROPLASTY Left 07/10/2015   Procedure: LEFT TOTAL KNEE ARTHROPLASTY;  Surgeon: Durene Romans, MD;  Location: [facility];  Service: Orthopedics;  Laterality: Left;  . tummy tuck      20 years ago  Social History:     Social History  Substance Use Topics  . Smoking status: Never Smoker  . Smokeless tobacco: Never Used  . Alcohol use Yes     Comment: occas glass of wine      Lives - at home w husband  Mobility - walks by self  Family History :     Family History  Problem Relation Age of Onset  . Parkinson's disease Mother   . CAD Father       Home Medications:   Prior to Admission medications   Medication Sig Start Date End Date Taking? Authorizing Provider  acetaminophen (TYLENOL) 500 MG tablet Take 2 tablets (1,000 mg total) by mouth every 8 (eight) hours. Patient taking differently: Take 1,000 mg by mouth every 8 (eight) hours as needed.  07/12/15  Yes Babish, Molli Hazard, PA-C  benazepril (LOTENSIN) 10 MG tablet Take 10 mg by mouth daily.  06/06/15  Yes [provider]  betamethasone dipropionate (DIPROLENE) 0.05 % cream Apply 1 application topically daily as needed (eczema).  05/29/15  Yes [provider]  celecoxib (CELEBREX) 200 MG capsule Take 200mg  by mouth once daily if needed for pain 03/30/15  Yes [provider]  polyethylene glycol (MIRALAX / GLYCOLAX) packet Take 17 g by mouth 2 (two) times daily. Patient taking differently: Take 17 g by mouth daily as needed for mild constipation.  07/12/15  Yes Lanney Gins, PA-C  PROAIR HFA 108 (90 Base) MCG/ACT inhaler Inhale 2 puffs into the  lungs every 4 (four) hours as needed for wheezing. 09/15/16  Yes [provider]  promethazine (PHENERGAN) 25 MG tablet Take 25 mg by mouth 3 (three) times daily as needed for nausea. 12/17/16  Yes [provider]  rosuvastatin (CRESTOR) 10 MG tablet Take 10 mg by mouth 2 (two) times a week.  06/21/15  Yes [provider]  SYNTHROID 100 MCG tablet Take 100 mcg by mouth daily before breakfast.  05/27/15  Yes [provider]  traMADol (ULTRAM) 50 MG tablet Take 50 mg by mouth every 6 (six) hours as needed for moderate pain.   Yes [provider]  Vitamin D, Ergocalciferol, (DRISDOL) 50000 units CAPS capsule TAKE 1 CAP BY MOUTH ONCE WEEKLY ON SUNDAYS 06/21/15  Yes [provider]  aspirin EC 325 MG EC tablet Take 1 tablet (325 mg total) by mouth 2 (two) times daily. Patient not taking: Reported on 12/19/2016 07/12/15   Lanney Gins, PA-C  docusate sodium (COLACE) 100 MG capsule Take 1 capsule (100 mg total) by mouth 2 (two) times daily. Patient not taking: Reported on 12/19/2016 07/12/15   Lanney Gins, PA-C  ferrous sulfate 325 (65 FE) MG tablet Take 1 tablet (325 mg total) by mouth 3 (three) times daily after meals. Patient not taking: Reported on 12/19/2016 07/12/15   Lanney Gins, PA-C  oxyCODONE (OXY IR/ROXICODONE) 5 MG immediate release tablet Take 1-3 tablets (5-15 mg total) by mouth every 4 (four) hours as needed for severe pain. Patient not taking: Reported on 12/19/2016 07/12/15   Lanney Gins, PA-C  tiZANidine (ZANAFLEX) 4 MG tablet Take 1 tablet (4 mg total) by mouth every 6 (six) hours as needed for muscle spasms. Patient not taking: Reported on 12/19/2016 07/12/15   Lanney Gins, PA-C     Allergies:     Allergies  Allergen Reactions  . Codeine Nausea And Vomiting  . Nubain [Nalbuphine] Nausea And Vomiting     Physical Exam:   Vitals  Blood pressure Marland Kitchen)  110/56, pulse 75, temperature (!) 102.8 F (39.3 C), temperature source  Rectal, resp. rate 16, height 5\' 4"  (1.626 m), weight 63 kg (139 lb), SpO2 98 %.   1. General lying in bed in NAD,    2. Normal affect and insight, Not Suicidal or Homicidal, Awake Alert, Oriented X 3.  3. No F.N deficits, ALL C.Nerves Intact, Strength 5/5 all 4 extremities, Sensation intact all 4 extremities, Plantars down going.  4. Ears and Eyes appear Normal, Conjunctivae clear, PERRLA. Moist Oral Mucosa.  5. Supple Neck, No JVD, No cervical lymphadenopathy appriciated, No Carotid Bruits.  6. Symmetrical Chest wall movement, Good air movement bilaterally, CTAB.  7. RRR, No Gallops, Rubs or Murmurs, No Parasternal Heave.  8. Positive Bowel Sounds, Abdomen Soft, No tenderness, No organomegaly appriciated,No rebound -guarding or rigidity.  9.  No Cyanosis, Normal Skin Turgor, No Skin Rash or Bruise.  10. Good muscle tone,  joints appear normal , no effusions, Normal ROM.  11. No Palpable Lymph Nodes in Neck or Axillae     Data Review:    CBC  Recent Labs Lab 12/19/16 0619  WBC 6.3  HGB 12.1  HCT 34.8*  PLT 159  MCV 86.6  MCH 30.1  MCHC 34.8  RDW 13.4  LYMPHSABS 0.5*  MONOABS 0.3  EOSABS 0.0  BASOSABS 0.0   ------------------------------------------------------------------------------------------------------------------  Chemistries   Recent Labs Lab 12/19/16 0619  NA 123*  K 3.7  CL 91*  CO2 18*  GLUCOSE 116*  BUN 18  CREATININE 1.08*  CALCIUM 8.5*  AST 33  ALT 15  ALKPHOS 42  BILITOT 1.1   ------------------------------------------------------------------------------------------------------------------ estimated creatinine clearance is 36.5 mL/min (A) (by C-G formula based on SCr of 1.08 mg/dL (H)). ------------------------------------------------------------------------------------------------------------------ No results for input(s): TSH, T4TOTAL, T3FREE, THYROIDAB in the last 72 hours.  Invalid input(s): FREET3  Coagulation profile No  results for input(s): INR, PROTIME in the last 168 hours. ------------------------------------------------------------------------------------------------------------------- No results for input(s): DDIMER in the last 72 hours. -------------------------------------------------------------------------------------------------------------------  Cardiac Enzymes No results for input(s): CKMB, TROPONINI, MYOGLOBIN in the last 168 hours.  Invalid input(s): CK ------------------------------------------------------------------------------------------------------------------ No results found for: BNP   ---------------------------------------------------------------------------------------------------------------  Urinalysis    Component Value Date/Time   COLORURINE YELLOW 07/12/2015 0916   APPEARANCEUR CLEAR 07/12/2015 0916   LABSPEC 1.011 07/12/2015 0916   PHURINE 6.5 07/12/2015 0916   GLUCOSEU NEGATIVE 07/12/2015 0916   HGBUR NEGATIVE 07/12/2015 0916   BILIRUBINUR NEGATIVE 07/12/2015 0916   KETONESUR NEGATIVE 07/12/2015 0916   PROTEINUR NEGATIVE 07/12/2015 0916   NITRITE NEGATIVE 07/12/2015 0916   LEUKOCYTESUR NEGATIVE 07/12/2015 0916    ----------------------------------------------------------------------------------------------------------------   Imaging Results:    Dg Chest 2 View  Result Date: 12/19/2016 CLINICAL DATA:  Fever.  Weakness. EXAM: CHEST  2 VIEW COMPARISON:  05/17/2009 FINDINGS: Normal heart size. Right posterior lung base opacity is identified compatible with pneumonia. Left lung is clear. No pleural effusion or edema. IMPRESSION: Right lower lobe pneumonia. Followup PA and lateral chest X-ray is recommended in 3-4 weeks following trial of antibiotic therapy to ensure resolution and exclude underlying malignancy. Electronically Signed   By: Signa Kellaylor  Stroud M.D.   On: 12/19/2016 08:23   Ct Abdomen Pelvis W Contrast  Result Date: 12/19/2016 CLINICAL DATA:  Abdominal  pain with nausea and vomiting. EXAM: CT ABDOMEN AND PELVIS WITH CONTRAST TECHNIQUE: Multidetector CT imaging of the abdomen and pelvis was performed using the standard protocol following bolus administration of intravenous contrast. CONTRAST:  80 mL Isovue-300 nonionic COMPARISON:  CT  abdomen and pelvis May 16, 2009 and March 12, 2012 FINDINGS: Lower chest: There is airspace consolidation in the right lower lobe. There is patchy atelectasis in the left base. Hepatobiliary: There is a cyst in the medial segment of the left lobe of the liver measuring 9 x 8 mm. No other focal liver lesions are evident. Gallbladder is absent. There is intrahepatic biliary duct dilatation. The common bile duct does not appear appreciably dilated. There is an apparent choledochocele in the gallbladder fossa region measuring 3.3 x 3.4 cm, slightly larger than on prior studies. There is no inflammation in this area. There is no air or wall thickening in this area. Pancreas: No pancreatic mass or inflammatory focus. Spleen: No splenic lesions are evident. Adrenals/Urinary Tract: Right adrenal appears normal. There is borderline left adrenal hypertrophy, stable. There is a cyst arising from the posterior lower pole left kidney measuring 3.2 x 2.5 cm. There is no hydronephrosis on either side. There is no evident renal or ureteral calculus on either side. Urinary bladder is midline with wall thickness within normal limits. Stomach/Bowel: Rectum is distended with fluid and air. No rectal wall thickening evident. There are scattered sigmoid diverticula without diverticulitis. There is no appreciable bowel wall or mesenteric thickening. There is no evident bowel obstruction. No free air or portal venous air. Vascular/Lymphatic: There are foci of atherosclerotic calcification in the aorta. There is no abdominal aortic aneurysm. Major mesenteric vessels appear patent. There is no appreciable adenopathy in the abdomen or pelvis. Reproductive:   Uterus is absent.  There is no pelvic mass. Other: There is scarring in each inguinal region, stable. Appendix is absent. There is no abscess or ascites is apparent in the abdomen or pelvis. No omental lesions are appreciable. Musculoskeletal: There is degenerative change in the lumbar spine. There are no blastic or lytic bone lesions. There is no intramuscular or abdominal wall lesion evident. IMPRESSION: 1. Airspace consolidation consistent with pneumonia right lower lobe. Atelectasis posterior left base. 2. Gallbladder absent. Choledochocele in the right upper quadrant, slightly larger than on previous study. No wall thickening or air in this area. There is intrahepatic biliary duct dilatation. Common bile duct appears unremarkable. 3. Sigmoid diverticula without diverticulitis evident. Rectum mildly distended with air and fluid. No bowel obstruction. No abscess. Appendix absent. 4.  Uterus absent.  Mild scarring in each inguinal region, stable. 5.  No renal or ureteral calculus.  No hydronephrosis. 6.  Aortic atherosclerosis. Aortic Atherosclerosis (ICD10-I70.0). Electronically Signed   By: Bretta Bang III M.D.   On: 12/19/2016 10:25      Assessment & Plan:    Active Problems:   Hyponatremia   CAP (community acquired pneumonia)   Diarrhea    CAP Sputum gram stain culture Blood culture x2 Await urine legionella, strep antigen levaquin 500mg  iv qday Check cbc, cmp in am  Hyponatremia Check serum osm, cortisol, tsh Check urine osm, urine Na Hydrate with ns iv   DIarrhea Stool culture C. Diff Flagyl 500mg  po tid  Vaginitis ? BV (per [facility]) Flagyl 500mg  po tid  Hypothyroidism Cont levothyroxine  Hypertension Cont benazepril   DVT Prophylaxis Lovenox  SCDs  AM Labs Ordered, also please review Full Orders  Family Communication: Admission, patients condition and plan of care including tests being ordered have been discussed with the patient  who indicate  understanding and agree with the plan and Code Status.  Code Status FULL CODE  Likely DC to  home  Condition GUARDED  Consults called: none  Admission status: inpatient  Time spent in minutes : 45   Pearson Grippe M.D on 12/19/2016 at 1:05 PM  Between 7am to 7pm - Pager - [phone] After 7pm go to [email or web address] - password [facility]  [facility] - Office  [phone]

## 2016-12-19 NOTE — Progress Notes (Signed)
Received report on pt.

## 2016-12-19 NOTE — Progress Notes (Signed)
PHARMACY NOTE:  ANTIMICROBIAL RENAL DOSAGE ADJUSTMENT  Current antimicrobial regimen includes a mismatch between antimicrobial dosage and estimated renal function.  As per policy approved by the Pharmacy & Therapeutics and Medical Executive Committees, the antimicrobial dosage will be adjusted accordingly.  Current antimicrobial dosage:  levaquin 500mg  IV q24hr  Indication: CAP  Renal Function:  Estimated Creatinine Clearance: 36.5 mL/min (A) (by C-G formula based on SCr of 1.08 mg/dL (H)). []      On intermittent HD, scheduled: []      On CRRT    Antimicrobial dosage has been changed to:   levaquin 500mg  x1 followed by 250mg  IV q24hr  Additional comments:   Thank you for allowing pharmacy to be a part of this patient's care.  Harland GermanAndrew Makylie Rivere, Pharm D 12/19/2016 7:36 PM

## 2016-12-19 NOTE — Progress Notes (Signed)
Dyanne CarrelDoris A Lindsey is a 81 y.o. female patient admitted from ED awake, alert - oriented  X 4 - no acute distress noted.  VSS - Blood pressure 120/77, pulse 72, temperature (!) 102.8 F (39.3 C), temperature source Rectal, resp. rate 17, height 5\' 4"  (1.626 m), weight 63 kg (139 lb), SpO2 96 %.    IV in place, occlusive dsg intact without redness.  Orientation to room, and floor completed with information packet given to patient/family.  Patient declined safety video at this time.  Admission INP armband ID verified with patient/family, and in place.   SR up x 2, fall assessment complete, with patient and family able to verbalize understanding of risk associated with falls, and verbalized understanding to call nsg before up out of bed.  Call light within reach, patient able to voice, and demonstrate understanding.  Skin, clean-dry- intact without evidence of bruising, or skin tears.   No evidence of skin break down noted on exam.     Will cont to eval and treat per MD orders.  Theodosia BlenderEvan J Kennedy Brines, RN 12/19/2016 3:09 PM

## 2016-12-19 NOTE — ED Notes (Signed)
Patient is stable and ready to be transport to the floor at this time.  Report was called to 5W RN.  Belongings taken with the patient to the floor.   

## 2016-12-19 NOTE — ED Triage Notes (Signed)
Pt in from home with c/o n/v/d x 6 days, fever x 2 days. Also has LLQ abdominal cramping. Rectal temp 102.8, was given 1000 Tylenol en route, 4 of zofran. Pt a&ox4, BP 122/70, HR 100. 20G to L hand. Pt states she went to doc on Wed, got Rx for phenergan and vomitting is same.

## 2016-12-19 NOTE — ED Notes (Signed)
Patient transported to X-ray 

## 2016-12-19 NOTE — ED Provider Notes (Signed)
[facility]-EMERGENCY DEPT Provider Note   CSN: [id number] Arrival date & time: 12/19/16  4098     History   Chief Complaint Chief Complaint  Patient presents with  . Fever  . Diarrhea  . Weakness    HPI Judy Lindsey is a 81 y.o. female.  HPI   81 year old female with history of hypertension, recurrent urinary tract infection, surgery including abdominal hysterectomy, cholecystectomy, and appendectomy presented ED via EMS for evaluation of fever. Patient report for the past 5-6 days she has been feeling bad. She endorse fever, chills, persistent nausea, vomiting 3-4 times of nonbloody nonbilious contents daily as well as having multiple bouts of nonbloody non-mucousy loose stools. Complaining of crampy left lower quadrant abdominal pain. She has decrease in appetite, has been mostly bed rest, and not eating much. She has history of recurrent low back pain and back pain has been bothering her. She feels weak. She denies having headache, URI symptoms, chest pain, shortness of breath, productive cough, or rash. She does endorse some mild dysuria including burning urination. She did take a Tylenol last night. Denies any recent travel or eating exotic food. No recent sick contact.  Past Medical History:  Diagnosis Date  . Arthritis   . Fall    down stairs   . Heart murmur   . Hemorrhoids   . History of urinary tract infection   . Hypertension   . Hypothyroidism   . Urinary incontinence     Patient Active Problem List   Diagnosis Date Noted  . Overweight (BMI 25.0-29.9) 07/11/2015  . S/P left TKA 07/10/2015    Past Surgical History:  Procedure Laterality Date  . ABDOMINAL HYSTERECTOMY    . ANKLE SURGERY     right had screw placed still has   . APPENDECTOMY    . arm surgery      had excess skin removed from under upper arm area bilat 05/16/2015   . CHOLECYSTECTOMY    . EYE SURGERY     cataract surgery bilat   . HEMORRHOID SURGERY    . HERNIA REPAIR    . TONSILLECTOMY    .  TOTAL KNEE ARTHROPLASTY Left 07/10/2015   Procedure: LEFT TOTAL KNEE ARTHROPLASTY;  Surgeon: Durene Romans, MD;  Location: [facility];  Service: Orthopedics;  Laterality: Left;  . tummy tuck      20 years ago     OB History    No data available       Home Medications    Prior to Admission medications   Medication Sig Start Date End Date Taking? Authorizing Provider  acetaminophen (TYLENOL) 500 MG tablet Take 2 tablets (1,000 mg total) by mouth every 8 (eight) hours. 07/12/15   Lanney Gins, PA-C  aspirin EC 325 MG EC tablet Take 1 tablet (325 mg total) by mouth 2 (two) times daily. 07/12/15   Lanney Gins, PA-C  benazepril (LOTENSIN) 10 MG tablet Take 10 mg by mouth daily.  06/06/15   [provider]  betamethasone dipropionate (DIPROLENE) 0.05 % cream Apply 1 application topically daily as needed (eczema).  05/29/15   [provider]  celecoxib (CELEBREX) 200 MG capsule Take 200mg  by mouth once daily if needed for pain 03/30/15   [provider]  Coenzyme Q10 100 MG TABS Take 100 mg by mouth daily.    [provider]  docusate sodium (COLACE) 100 MG capsule Take 1 capsule (100 mg total) by mouth 2 (two) times daily. 07/12/15   Lanney Gins, PA-C  ferrous sulfate 325 (65 FE) MG tablet Take 1 tablet (325 mg total) by mouth 3 (three) times daily after meals. 07/12/15   Lanney Gins, PA-C  oxyCODONE (OXY IR/ROXICODONE) 5 MG immediate release tablet Take 1-3 tablets (5-15 mg total) by mouth every 4 (four) hours as needed for severe pain. 07/12/15   Lanney Gins, PA-C  polyethylene glycol (MIRALAX / GLYCOLAX) packet Take 17 g by mouth 2 (two) times daily. 07/12/15   Lanney Gins, PA-C  rosuvastatin (CRESTOR) 10 MG tablet Take 10 mg by mouth daily. 06/21/15   [provider]  SYNTHROID 100 MCG tablet Take 100 mcg by mouth daily before breakfast.  05/27/15   [provider]  tiZANidine (ZANAFLEX) 4 MG tablet Take 1 tablet (4 mg total) by mouth  every 6 (six) hours as needed for muscle spasms. 07/12/15   Lanney Gins, PA-C  Vitamin D, Ergocalciferol, (DRISDOL) 50000 units CAPS capsule TAKLE 50000units BY MOUTH TWICE A WEEK ON SUNDAYS AND WEDNESDAYS. 06/21/15   [provider]  vitamin E 400 UNIT capsule Take 400 Units by mouth daily.    [provider]    Family History No family history on file.  Social History Social History  Substance Use Topics  . Smoking status: Never Smoker  . Smokeless tobacco: Never Used  . Alcohol use Yes     Comment: occas glass of wine      Allergies   Codeine and Nubain [nalbuphine]   Review of Systems Review of Systems  All other systems reviewed and are negative.    Physical Exam Updated Vital Signs Temp (!) 102.8 F (39.3 C) (Rectal)   Ht 5\' 4"  (1.626 m)   Wt 63 kg (139 lb)   SpO2 92%   BMI 23.86 kg/m   Physical Exam  Constitutional: She is oriented to person, place, and time. She appears well-developed and well-nourished. No distress.  Ill-appearing female resting then appears uncomfortable  HENT:  Head: Atraumatic.  Mouth is dry  Eyes: Conjunctivae are normal.  Neck: Neck supple.  No nuchal rigidity  Cardiovascular: Intact distal pulses.   Tachycardia without murmurs rubs or gallops  Pulmonary/Chest:  Breathing with poor effort, faint crackle heard at the base of lung, no wheezes rhonchi  Abdominal: Soft. Bowel sounds are normal. She exhibits no distension. There is tenderness (Tenderness to left lower quadrant on palpation without guarding or rebound tenderness).  Neurological: She is alert and oriented to person, place, and time.  Skin: Skin is warm. No rash noted.  Psychiatric: She has a normal mood and affect.  Nursing note and vitals reviewed.    ED Treatments / Results  Labs (all labs ordered are listed, but only abnormal results are displayed) Labs Reviewed  CBC WITH DIFFERENTIAL/PLATELET - Abnormal; Notable for the following:        Result Value   HCT 34.8 (*)    Lymphs Abs 0.5 (*)    All other components within normal limits  COMPREHENSIVE METABOLIC PANEL - Abnormal; Notable for the following:    Sodium 123 (*)    Chloride 91 (*)    CO2 18 (*)    Glucose, Bld 116 (*)    Creatinine, Ser 1.08 (*)    Calcium 8.5 (*)    Total Protein 6.0 (*)    Albumin 3.1 (*)    GFR calc non Af Amer 48 (*)    GFR calc Af Amer 55 (*)    All other components within normal limits  CULTURE, BLOOD (  ROUTINE X 2)  CULTURE, BLOOD (ROUTINE X 2)  URINE CULTURE  C DIFFICILE QUICK SCREEN W PCR REFLEX  URINALYSIS, ROUTINE W REFLEX MICROSCOPIC  I-STAT CG4 LACTIC ACID, ED    EKG  EKG Interpretation None     ED ECG REPORT   Date: 12/19/2016  Rate: 98  Rhythm: normal sinus rhythm  QRS Axis: left  Intervals: normal  ST/T Wave abnormalities: nonspecific ST/T changes  Conduction Disutrbances:none  Narrative Interpretation:   Old EKG Reviewed: unchanged  I have personally reviewed the EKG tracing and agree with the computerized printout as noted.   Radiology Dg Chest 2 View  Result Date: 12/19/2016 CLINICAL DATA:  Fever.  Weakness. EXAM: CHEST  2 VIEW COMPARISON:  05/17/2009 FINDINGS: Normal heart size. Right posterior lung base opacity is identified compatible with pneumonia. Left lung is clear. No pleural effusion or edema. IMPRESSION: Right lower lobe pneumonia. Followup PA and lateral chest X-ray is recommended in 3-4 weeks following trial of antibiotic therapy to ensure resolution and exclude underlying malignancy. Electronically Signed   By: Signa Kellaylor  Stroud M.D.   On: 12/19/2016 08:23   Ct Abdomen Pelvis W Contrast  Result Date: 12/19/2016 CLINICAL DATA:  Abdominal pain with nausea and vomiting. EXAM: CT ABDOMEN AND PELVIS WITH CONTRAST TECHNIQUE: Multidetector CT imaging of the abdomen and pelvis was performed using the standard protocol following bolus administration of intravenous contrast. CONTRAST:  80 mL Isovue-300  nonionic COMPARISON:  CT abdomen and pelvis May 16, 2009 and March 12, 2012 FINDINGS: Lower chest: There is airspace consolidation in the right lower lobe. There is patchy atelectasis in the left base. Hepatobiliary: There is a cyst in the medial segment of the left lobe of the liver measuring 9 x 8 mm. No other focal liver lesions are evident. Gallbladder is absent. There is intrahepatic biliary duct dilatation. The common bile duct does not appear appreciably dilated. There is an apparent choledochocele in the gallbladder fossa region measuring 3.3 x 3.4 cm, slightly larger than on prior studies. There is no inflammation in this area. There is no air or wall thickening in this area. Pancreas: No pancreatic mass or inflammatory focus. Spleen: No splenic lesions are evident. Adrenals/Urinary Tract: Right adrenal appears normal. There is borderline left adrenal hypertrophy, stable. There is a cyst arising from the posterior lower pole left kidney measuring 3.2 x 2.5 cm. There is no hydronephrosis on either side. There is no evident renal or ureteral calculus on either side. Urinary bladder is midline with wall thickness within normal limits. Stomach/Bowel: Rectum is distended with fluid and air. No rectal wall thickening evident. There are scattered sigmoid diverticula without diverticulitis. There is no appreciable bowel wall or mesenteric thickening. There is no evident bowel obstruction. No free air or portal venous air. Vascular/Lymphatic: There are foci of atherosclerotic calcification in the aorta. There is no abdominal aortic aneurysm. Major mesenteric vessels appear patent. There is no appreciable adenopathy in the abdomen or pelvis. Reproductive:  Uterus is absent.  There is no pelvic mass. Other: There is scarring in each inguinal region, stable. Appendix is absent. There is no abscess or ascites is apparent in the abdomen or pelvis. No omental lesions are appreciable. Musculoskeletal: There is  degenerative change in the lumbar spine. There are no blastic or lytic bone lesions. There is no intramuscular or abdominal wall lesion evident. IMPRESSION: 1. Airspace consolidation consistent with pneumonia right lower lobe. Atelectasis posterior left base. 2. Gallbladder absent. Choledochocele in the right upper quadrant, slightly larger  than on previous study. No wall thickening or air in this area. There is intrahepatic biliary duct dilatation. Common bile duct appears unremarkable. 3. Sigmoid diverticula without diverticulitis evident. Rectum mildly distended with air and fluid. No bowel obstruction. No abscess. Appendix absent. 4.  Uterus absent.  Mild scarring in each inguinal region, stable. 5.  No renal or ureteral calculus.  No hydronephrosis. 6.  Aortic atherosclerosis. Aortic Atherosclerosis (ICD10-I70.0). Electronically Signed   By: Bretta Bang III M.D.   On: 12/19/2016 10:25    Procedures Procedures (including critical care time)  Medications Ordered in ED Medications  azithromycin (ZITHROMAX) 500 mg in dextrose 5 % 250 mL IVPB (500 mg Intravenous New Bag/Given 12/19/16 0957)  sodium chloride 0.9 % bolus 1,000 mL (1,000 mLs Intravenous New Bag/Given 12/19/16 0724)  sodium chloride 0.9 % bolus 1,000 mL (0 mLs Intravenous Stopped 12/19/16 0910)  piperacillin-tazobactam (ZOSYN) IVPB 3.375 g (0 g Intravenous Stopped 12/19/16 0754)  acetaminophen (TYLENOL) tablet 650 mg (650 mg Oral Given 12/19/16 0724)  ondansetron (ZOFRAN) injection 4 mg (4 mg Intravenous Given 12/19/16 0725)  iopamidol (ISOVUE-300) 61 % injection (80 mLs Intravenous Contrast Given 12/19/16 1028)     Initial Impression / Assessment and Plan / ED Course  I have reviewed the triage vital signs and the nursing notes.  Pertinent labs & imaging results that were available during my care of the patient were reviewed by me and considered in my medical decision making (see chart for details).     BP (!) 110/56   Pulse 75    Temp (!) 102.8 F (39.3 C) (Rectal)   Resp 16   Ht 5\' 4"  (1.626 m)   Wt 63 kg (139 lb)   SpO2 98%   BMI 23.86 kg/m    Final Clinical Impressions(s) / ED Diagnoses   Final diagnoses:  Community acquired pneumonia of right lower lobe of lung (HCC)    New Prescriptions New Prescriptions   No medications on file   6:47 AM Patient here with nausea vomiting diarrhea along with left lower quadrant abdominal pain suggestive of diverticular disease. She'll benefit from an abdominal and pelvis CT scan for further evaluation. Code sepsis initiated, abx given for suspect intra-abdominal cause.  IVF resuscitation at 71ml/kg.  Tylenol for fever.    8:35 AM CXR showing LLL pna.  With the constitution of her sxs (pna, hyponatremia, diarrhea), pt may have Legionella disease.  Pt was given Zosyn, will add arithromycin to cover for atypical pna. Continue with IVF.  C.diff culture obtained.  No recent abx use.  Currently awaits abd/pelvis CT.  Family member only report occasional dry cough for several weeks.    10:38 AM Abd/pelvis CT showing RLL pna.  Evidence of diverticula without diverticulitis.  GIven this finding, suspect Legionella disease causing her sxs.  Will consult medicine for admission.    11:08 AM Appreciate consultation from [facility] Dr. Selena Batten who agrees to see and admit pt for further care.  Currently pt does not have an elevated WBC, normal lactic acid and BP stable.  Code sepsis can be cancel.     Fayrene Helper, PA-C 12/19/16 1110    Dione Booze, MD 12/20/16 [phone]

## 2016-12-20 DIAGNOSIS — A4151 Sepsis due to Escherichia coli [E. coli]: Secondary | ICD-10-CM

## 2016-12-20 DIAGNOSIS — E039 Hypothyroidism, unspecified: Secondary | ICD-10-CM

## 2016-12-20 LAB — CBC
HCT: 29.5 % — ABNORMAL LOW (ref 36.0–46.0)
Hemoglobin: 10.4 g/dL — ABNORMAL LOW (ref 12.0–15.0)
MCH: 30.4 pg (ref 26.0–34.0)
MCHC: 35.3 g/dL (ref 30.0–36.0)
MCV: 86.3 fL (ref 78.0–100.0)
Platelets: 121 10*3/uL — ABNORMAL LOW (ref 150–400)
RBC: 3.42 MIL/uL — ABNORMAL LOW (ref 3.87–5.11)
RDW: 13.3 % (ref 11.5–15.5)
WBC: 3.1 10*3/uL — ABNORMAL LOW (ref 4.0–10.5)

## 2016-12-20 LAB — URINE CULTURE: CULTURE: NO GROWTH

## 2016-12-20 LAB — HIV ANTIBODY (ROUTINE TESTING W REFLEX): HIV Screen 4th Generation wRfx: NONREACTIVE

## 2016-12-20 MED ORDER — ACETAMINOPHEN 325 MG PO TABS
650.0000 mg | ORAL_TABLET | ORAL | Status: DC | PRN
  Administered 2016-12-20 – 2016-12-21 (×4): 650 mg via ORAL
  Filled 2016-12-20 (×4): qty 2

## 2016-12-20 MED ORDER — ONDANSETRON HCL 4 MG/2ML IJ SOLN
4.0000 mg | Freq: Four times a day (QID) | INTRAMUSCULAR | Status: DC | PRN
  Administered 2016-12-21: 4 mg via INTRAVENOUS
  Filled 2016-12-20: qty 2

## 2016-12-20 MED ORDER — DEXTROSE 5 % IV SOLN
500.0000 mg | INTRAVENOUS | Status: DC
Start: 2016-12-20 — End: 2016-12-23
  Administered 2016-12-20 – 2016-12-22 (×3): 500 mg via INTRAVENOUS
  Filled 2016-12-20 (×5): qty 500

## 2016-12-20 MED ORDER — SODIUM CHLORIDE 0.9 % IV SOLN
INTRAVENOUS | Status: DC
  Administered 2016-12-20: 20:00:00 via INTRAVENOUS

## 2016-12-20 MED ORDER — DIPHENHYDRAMINE HCL 25 MG PO CAPS
50.0000 mg | ORAL_CAPSULE | Freq: Every evening | ORAL | Status: DC | PRN
  Administered 2016-12-21: 50 mg via ORAL
  Filled 2016-12-20: qty 2

## 2016-12-20 MED ORDER — DEXTROSE 5 % IV SOLN
2.0000 g | INTRAVENOUS | Status: DC
  Administered 2016-12-20 – 2016-12-22 (×3): 2 g via INTRAVENOUS
  Filled 2016-12-20 (×5): qty 2

## 2016-12-20 MED ORDER — SALINE SPRAY 0.65 % NA SOLN
1.0000 | NASAL | Status: DC | PRN
  Filled 2016-12-20: qty 44

## 2016-12-20 MED ORDER — SODIUM CHLORIDE 0.9 % IV BOLUS (SEPSIS)
500.0000 mL | Freq: Once | INTRAVENOUS | Status: AC
  Administered 2016-12-20: 500 mL via INTRAVENOUS

## 2016-12-20 NOTE — Progress Notes (Signed)
PROGRESS NOTE    Judy Lindsey  ZOX:[id number] DOB: November 05, 1935 DOA: 12/19/2016 PCP: Maurice Small, MD   Chief Complaint  Patient presents with  . Fever  . Diarrhea  . Weakness    Brief Narrative:  HPI On 12/19/2016 by Dr. Pearson Grippe Sanari Offner is a 81 y.o. female, w hypertension, hypothyroidism, apparently present from home with fever intermittent for about 1 week.  Slight cough.  Slight sob, generalized weakness. + diarrhea,  No recent travel or odd food eaten.  Tried immodium and helped some .  Pt was seen on Wednesday,  T99, and ua negative, but some  vaginal infection.   Pt presented to ED for evaluation.   In ED  Wbc 6.3,  Hgb 12.1 ,  Na 123 , CXR +> + pneumonia RLL   Pt will be admitted for pneumonia and diarrrrhea.    Assessment & Plan   Sepsis secondary Community Acquired pneumonia -Presented with fever, tachycardia, tachypnea -CXR right lower lobe pneumonia -Blood cultures show no growth to date -Strep pneumonia urine antigen negative -CT abdomen and pelvis also noted airspace consolidation right lower lobe -Patient was placed on Levaquin however will discontinue in lieu of her diarrhea -Will place patient on azithromycin and ceftriaxone  Hyponatremia -Sodium 123, will continue IV fluids and continue to monitor BMP -Currently patient has no neurological deficits  Diarrhea and nausea -Cdiff negative  -continue supportive care -CT abdomen and pelvis showed choledochocele in RUQ -will order zofran for nausea   Essential hypertension -continue benazepril   Hypothyroidism -Continue synthroid  Vaginitis -Diagnosed by her primary care physician, continue Flagyl 500 mg 3 times a day  Hyperlipidemia  -Continue statin   Vitamin D deficiency -Continue Vitamin D supplementation  DVT Prophylaxis  Lovenox  Code Status: Full  Family Communication: Husband at bedside   Disposition Plan: admitted. Possible discharge within 24-48 hours pending  improvement  Consultants None  Procedures  None  Antibiotics   Anti-infectives    Start     Dose/Rate Route Frequency Ordered Stop   12/21/16 1000  Levofloxacin (LEVAQUIN) IVPB 250 mg  Status:  Discontinued     250 mg 50 mL/hr over 60 Minutes Intravenous Every 24 hours 12/19/16 1938 12/20/16 0851   12/20/16 1000  levofloxacin (LEVAQUIN) IVPB 500 mg  Status:  Discontinued     500 mg 100 mL/hr over 60 Minutes Intravenous Every 24 hours 12/19/16 1207 12/19/16 1935   12/20/16 1000  Levofloxacin (LEVAQUIN) IVPB 250 mg  Status:  Discontinued     250 mg 50 mL/hr over 60 Minutes Intravenous Every 24 hours 12/19/16 1935 12/19/16 1938   12/20/16 1000  levofloxacin (LEVAQUIN) IVPB 500 mg  Status:  Discontinued     500 mg 100 mL/hr over 60 Minutes Intravenous Every 24 hours 12/19/16 1938 12/20/16 0916   12/20/16 0930  cefTRIAXone (ROCEPHIN) 2 g in dextrose 5 % 50 mL IVPB     2 g 100 mL/hr over 30 Minutes Intravenous Every 24 hours 12/20/16 0926     12/20/16 0900  azithromycin (ZITHROMAX) 500 mg in dextrose 5 % 250 mL IVPB     500 mg 250 mL/hr over 60 Minutes Intravenous Every 24 hours 12/20/16 0851     12/19/16 1600  metroNIDAZOLE (FLAGYL) tablet 500 mg     500 mg Oral Every 8 hours 12/19/16 1257     12/19/16 0845  azithromycin (ZITHROMAX) 500 mg in dextrose 5 % 250 mL IVPB     500 mg 250 mL/hr  over 60 Minutes Intravenous  Once 12/19/16 0833 12/19/16 1057   12/19/16 0645  piperacillin-tazobactam (ZOSYN) IVPB 3.375 g     3.375 g 100 mL/hr over 30 Minutes Intravenous  Once 12/19/16 4098 12/19/16 0754      Subjective:   Judy Lindsey seen and examined today.  Patient denies any current chest pain, shortness breath, abdominal pain, headache or dizziness. Does complain of some diarrhea as well as nausea.   Objective:   Vitals:   12/19/16 1511 12/19/16 2135 12/20/16 0456 12/20/16 1348  BP: (!) 106/56 (!) 95/45 (!) 126/53 (!) 103/53  Pulse: 70 80 92 80  Resp: 16 18  16   Temp: 98.6 F  (37 C) 98.9 F (37.2 C) (!) 102.1 F (38.9 C) 98 F (36.7 C)  TempSrc: Oral Oral Oral Oral  SpO2: 98% 94% 91% 94%  Weight:      Height:        Intake/Output Summary (Last 24 hours) at 12/20/16 1511 Last data filed at 12/20/16 1191  Gross per 24 hour  Intake          1353.75 ml  Output              200 ml  Net          1153.75 ml   Filed Weights   12/19/16 0624  Weight: 63 kg (139 lb)    Exam  General: Well developed, well nourished, NAD, appears stated age  HEENT: NCAT,mucous membranes moist.   Cardiovascular: S1 S2 auscultated, no rubs, murmurs or gallops. Regular rate and rhythm.  Respiratory: Clear to auscultation bilaterally with equal chest rise   Abdomen: Soft, nontender, nondistended, + bowel sounds  Extremities: warm dry without cyanosis clubbing or edema  Neuro: AAOx3, nonfocal  Psych: Normal affect and demeanor with intact judgement and insight   Data Reviewed: I have personally reviewed following labs and imaging studies  CBC:  Recent Labs Lab 12/19/16 0619 12/20/16 0444  WBC 6.3 3.1*  NEUTROABS 5.5  --   HGB 12.1 10.4*  HCT 34.8* 29.5*  MCV 86.6 86.3  PLT 159 121*   Basic Metabolic Panel:  Recent Labs Lab 12/19/16 0619  NA 123*  K 3.7  CL 91*  CO2 18*  GLUCOSE 116*  BUN 18  CREATININE 1.08*  CALCIUM 8.5*   GFR: Estimated Creatinine Clearance: 36.5 mL/min (A) (by C-G formula based on SCr of 1.08 mg/dL (H)). Liver Function Tests:  Recent Labs Lab 12/19/16 0619  AST 33  ALT 15  ALKPHOS 42  BILITOT 1.1  PROT 6.0*  ALBUMIN 3.1*   No results for input(s): LIPASE, AMYLASE in the last 168 hours. No results for input(s): AMMONIA in the last 168 hours. Coagulation Profile: No results for input(s): INR, PROTIME in the last 168 hours. Cardiac Enzymes: No results for input(s): CKTOTAL, CKMB, CKMBINDEX, TROPONINI in the last 168 hours. BNP (last 3 results) No results for input(s): PROBNP in the last 8760 hours. HbA1C: No  results for input(s): HGBA1C in the last 72 hours. CBG: No results for input(s): GLUCAP in the last 168 hours. Lipid Profile: No results for input(s): CHOL, HDL, LDLCALC, TRIG, CHOLHDL, LDLDIRECT in the last 72 hours. Thyroid Function Tests:  Recent Labs  12/19/16 1530  TSH 0.415   Anemia Panel: No results for input(s): VITAMINB12, FOLATE, FERRITIN, TIBC, IRON, RETICCTPCT in the last 72 hours. Urine analysis:    Component Value Date/Time   COLORURINE YELLOW 12/19/2016 1559   APPEARANCEUR CLEAR 12/19/2016 1559  LABSPEC 1.031 (H) 12/19/2016 1559   PHURINE 6.0 12/19/2016 1559   GLUCOSEU NEGATIVE 12/19/2016 1559   HGBUR NEGATIVE 12/19/2016 1559   BILIRUBINUR NEGATIVE 12/19/2016 1559   KETONESUR 20 (A) 12/19/2016 1559   PROTEINUR NEGATIVE 12/19/2016 1559   NITRITE NEGATIVE 12/19/2016 1559   LEUKOCYTESUR NEGATIVE 12/19/2016 1559   Sepsis Labs: @LABRCNTIP (procalcitonin:4,lacticidven:4)  ) Recent Results (from the past 240 hour(s))  Blood culture (routine x 2)     Status: None (Preliminary result)   Collection Time: 12/19/16  6:19 AM  Result Value Ref Range Status   Specimen Description BLOOD RIGHT HAND  Final   Special Requests   Final    BOTTLES DRAWN AEROBIC AND ANAEROBIC Blood Culture adequate volume   Culture NO GROWTH 1 DAY  Final   Report Status PENDING  Incomplete  Blood culture (routine x 2)     Status: None (Preliminary result)   Collection Time: 12/19/16  6:24 AM  Result Value Ref Range Status   Specimen Description BLOOD LEFT HAND  Final   Special Requests   Final    BOTTLES DRAWN AEROBIC AND ANAEROBIC Blood Culture adequate volume   Culture NO GROWTH 1 DAY  Final   Report Status PENDING  Incomplete  C difficile quick scan w PCR reflex     Status: None   Collection Time: 12/19/16 11:20 AM  Result Value Ref Range Status   C Diff antigen NEGATIVE NEGATIVE Final   C Diff toxin NEGATIVE NEGATIVE Final   C Diff interpretation No C. difficile detected.  Final       Radiology Studies: Dg Chest 2 View  Result Date: 12/19/2016 CLINICAL DATA:  Fever.  Weakness. EXAM: CHEST  2 VIEW COMPARISON:  05/17/2009 FINDINGS: Normal heart size. Right posterior lung base opacity is identified compatible with pneumonia. Left lung is clear. No pleural effusion or edema. IMPRESSION: Right lower lobe pneumonia. Followup PA and lateral chest X-ray is recommended in 3-4 weeks following trial of antibiotic therapy to ensure resolution and exclude underlying malignancy. Electronically Signed   By: Signa Kell M.D.   On: 12/19/2016 08:23   Ct Abdomen Pelvis W Contrast  Result Date: 12/19/2016 CLINICAL DATA:  Abdominal pain with nausea and vomiting. EXAM: CT ABDOMEN AND PELVIS WITH CONTRAST TECHNIQUE: Multidetector CT imaging of the abdomen and pelvis was performed using the standard protocol following bolus administration of intravenous contrast. CONTRAST:  80 mL Isovue-300 nonionic COMPARISON:  CT abdomen and pelvis May 16, 2009 and March 12, 2012 FINDINGS: Lower chest: There is airspace consolidation in the right lower lobe. There is patchy atelectasis in the left base. Hepatobiliary: There is a cyst in the medial segment of the left lobe of the liver measuring 9 x 8 mm. No other focal liver lesions are evident. Gallbladder is absent. There is intrahepatic biliary duct dilatation. The common bile duct does not appear appreciably dilated. There is an apparent choledochocele in the gallbladder fossa region measuring 3.3 x 3.4 cm, slightly larger than on prior studies. There is no inflammation in this area. There is no air or wall thickening in this area. Pancreas: No pancreatic mass or inflammatory focus. Spleen: No splenic lesions are evident. Adrenals/Urinary Tract: Right adrenal appears normal. There is borderline left adrenal hypertrophy, stable. There is a cyst arising from the posterior lower pole left kidney measuring 3.2 x 2.5 cm. There is no hydronephrosis on either side.  There is no evident renal or ureteral calculus on either side. Urinary bladder is midline with wall  thickness within normal limits. Stomach/Bowel: Rectum is distended with fluid and air. No rectal wall thickening evident. There are scattered sigmoid diverticula without diverticulitis. There is no appreciable bowel wall or mesenteric thickening. There is no evident bowel obstruction. No free air or portal venous air. Vascular/Lymphatic: There are foci of atherosclerotic calcification in the aorta. There is no abdominal aortic aneurysm. Major mesenteric vessels appear patent. There is no appreciable adenopathy in the abdomen or pelvis. Reproductive:  Uterus is absent.  There is no pelvic mass. Other: There is scarring in each inguinal region, stable. Appendix is absent. There is no abscess or ascites is apparent in the abdomen or pelvis. No omental lesions are appreciable. Musculoskeletal: There is degenerative change in the lumbar spine. There are no blastic or lytic bone lesions. There is no intramuscular or abdominal wall lesion evident. IMPRESSION: 1. Airspace consolidation consistent with pneumonia right lower lobe. Atelectasis posterior left base. 2. Gallbladder absent. Choledochocele in the right upper quadrant, slightly larger than on previous study. No wall thickening or air in this area. There is intrahepatic biliary duct dilatation. Common bile duct appears unremarkable. 3. Sigmoid diverticula without diverticulitis evident. Rectum mildly distended with air and fluid. No bowel obstruction. No abscess. Appendix absent. 4.  Uterus absent.  Mild scarring in each inguinal region, stable. 5.  No renal or ureteral calculus.  No hydronephrosis. 6.  Aortic atherosclerosis. Aortic Atherosclerosis (ICD10-I70.0). Electronically Signed   By: Bretta BangWilliam  Woodruff III M.D.   On: 12/19/2016 10:25     Scheduled Meds: . enoxaparin (LOVENOX) injection  40 mg Subcutaneous Q24H  . feeding supplement (PRO-STAT SUGAR FREE 64)   30 mL Oral BID  . levothyroxine  100 mcg Oral QAC breakfast  . metroNIDAZOLE  500 mg Oral Q8H  . [START ON 12/22/2016] rosuvastatin  10 mg Oral Once per day on Mon Thu  . [START ON 12/21/2016] Vitamin D (Ergocalciferol)  50,000 Units Oral Q Sun   Continuous Infusions: . azithromycin Stopped (12/20/16 1157)  . cefTRIAXone (ROCEPHIN)  IV Stopped (12/20/16 1032)     LOS: 1 day   Time Spent in minutes   30 minutes  Erubiel Manasco D.O. on 12/20/2016 at 3:11 PM  Between 7am to 7pm - Pager - [phone]  After 7pm go to [email or web address] - password [email or web address]  And look for the night coverage person covering for me after hours  [facility] Office  [phone]

## 2016-12-20 NOTE — Progress Notes (Signed)
Pharmacy Antibiotic Note  Judy CarrelDoris A Lindsey is a 81 y.o. female admitted on 12/19/2016 with community acquired pneumonia.  Pharmacy has been consulted for ceftriaxone dosing.  WBC 3.1, slightly febrile, Tmax 102.1.  Pending blood and urine cultures.  Plan: Ceftriaxone 2 grams IV q24h Monitor for clinical improvement and culture results.  Height: 5\' 4"  (162.6 cm) Weight: 139 lb (63 kg) IBW/kg (Calculated) : 54.7  Temp (24hrs), Avg:99.9 F (37.7 C), Min:98.6 F (37 C), Max:102.1 F (38.9 C)   Recent Labs Lab 12/19/16 0619 12/19/16 0725 12/20/16 0444  WBC 6.3  --  3.1*  CREATININE 1.08*  --   --   LATICACIDVEN  --  1.11  --     Estimated Creatinine Clearance: 36.5 mL/min (A) (by C-G formula based on SCr of 1.08 mg/dL (H)).    Allergies  Allergen Reactions  . Codeine Nausea And Vomiting  . Nubain [Nalbuphine] Nausea And Vomiting    Antimicrobials this admission: 8/10 Zosyn x 1 8/10 Azithro x 1 8/10 Metronidazole >>  8/11 Rocephin (CAP) >>  Microbiology results: Cdiff negative Pending blood and urine cultures  Thank you for allowing pharmacy to be a part of this patient's care.  Judy Lindsey, PharmD Pharmacy Resident Pager: [phone] 12/20/2016 9:35 AM

## 2016-12-20 NOTE — Progress Notes (Deleted)
Pt refusing Lab draw this am.

## 2016-12-21 DIAGNOSIS — E785 Hyperlipidemia, unspecified: Secondary | ICD-10-CM

## 2016-12-21 DIAGNOSIS — A419 Sepsis, unspecified organism: Principal | ICD-10-CM

## 2016-12-21 DIAGNOSIS — R110 Nausea: Secondary | ICD-10-CM

## 2016-12-21 DIAGNOSIS — N761 Subacute and chronic vaginitis: Secondary | ICD-10-CM

## 2016-12-21 DIAGNOSIS — E559 Vitamin D deficiency, unspecified: Secondary | ICD-10-CM

## 2016-12-21 DIAGNOSIS — I10 Essential (primary) hypertension: Secondary | ICD-10-CM

## 2016-12-21 DIAGNOSIS — E876 Hypokalemia: Secondary | ICD-10-CM

## 2016-12-21 LAB — BASIC METABOLIC PANEL
ANION GAP: 7 (ref 5–15)
BUN: <5 mg/dL — ABNORMAL LOW (ref 6–20)
CALCIUM: 7.6 mg/dL — AB (ref 8.9–10.3)
CO2: 24 mmol/L (ref 22–32)
Chloride: 101 mmol/L (ref 101–111)
Creatinine, Ser: 0.46 mg/dL (ref 0.44–1.00)
Glucose, Bld: 102 mg/dL — ABNORMAL HIGH (ref 65–99)
Potassium: 3.4 mmol/L — ABNORMAL LOW (ref 3.5–5.1)
SODIUM: 132 mmol/L — AB (ref 135–145)

## 2016-12-21 LAB — CBC
HEMATOCRIT: 31 % — AB (ref 36.0–46.0)
Hemoglobin: 10.4 g/dL — ABNORMAL LOW (ref 12.0–15.0)
MCH: 29 pg (ref 26.0–34.0)
MCHC: 33.5 g/dL (ref 30.0–36.0)
MCV: 86.4 fL (ref 78.0–100.0)
PLATELETS: 140 10*3/uL — AB (ref 150–400)
RBC: 3.59 MIL/uL — ABNORMAL LOW (ref 3.87–5.11)
RDW: 13.5 % (ref 11.5–15.5)
WBC: 3 10*3/uL — AB (ref 4.0–10.5)

## 2016-12-21 MED ORDER — BENZONATATE 100 MG PO CAPS
200.0000 mg | ORAL_CAPSULE | Freq: Three times a day (TID) | ORAL | Status: DC
  Administered 2016-12-21 – 2016-12-23 (×6): 200 mg via ORAL
  Filled 2016-12-21 (×6): qty 2

## 2016-12-21 MED ORDER — POTASSIUM CHLORIDE CRYS ER 20 MEQ PO TBCR
40.0000 meq | EXTENDED_RELEASE_TABLET | Freq: Once | ORAL | Status: AC
  Administered 2016-12-21: 40 meq via ORAL
  Filled 2016-12-21: qty 2

## 2016-12-21 MED ORDER — LOPERAMIDE HCL 2 MG PO CAPS
2.0000 mg | ORAL_CAPSULE | ORAL | Status: DC | PRN
  Administered 2016-12-21: 2 mg via ORAL
  Filled 2016-12-21: qty 1

## 2016-12-21 MED ORDER — IPRATROPIUM-ALBUTEROL 0.5-2.5 (3) MG/3ML IN SOLN
3.0000 mL | Freq: Three times a day (TID) | RESPIRATORY_TRACT | Status: DC
  Administered 2016-12-21 – 2016-12-23 (×6): 3 mL via RESPIRATORY_TRACT
  Filled 2016-12-21 (×6): qty 3

## 2016-12-21 MED ORDER — HYDROCODONE-HOMATROPINE 5-1.5 MG/5ML PO SYRP
5.0000 mL | ORAL_SOLUTION | Freq: Four times a day (QID) | ORAL | Status: DC | PRN
  Administered 2016-12-21 – 2016-12-22 (×4): 5 mL via ORAL
  Filled 2016-12-21 (×4): qty 5

## 2016-12-21 NOTE — Progress Notes (Signed)
PROGRESS NOTE    ANALY Lindsey  ZOX:[id number] DOB: 1935/12/09 DOA: 12/19/2016 PCP: Maurice Small, MD   Chief Complaint  Patient presents with  . Fever  . Diarrhea  . Weakness    Brief Narrative:  HPI On 12/19/2016 by Dr. Pearson Grippe Judy Lindsey is a 81 y.o. female, w hypertension, hypothyroidism, apparently present from home with fever intermittent for about 1 week.  Slight cough.  Slight sob, generalized weakness. + diarrhea,  No recent travel or odd food eaten.  Tried immodium and helped some .  Pt was seen on Wednesday,  T99, and ua negative, but some  vaginal infection.   Pt presented to ED for evaluation.   In ED  Wbc 6.3,  Hgb 12.1 ,  Na 123 , CXR +> + pneumonia RLL   Pt will be admitted for pneumonia and diarrrrhea.    Assessment & Plan   Sepsis and dyspnea secondary Community Acquired pneumonia -Presented with fever, tachycardia, tachypnea -CXR right lower lobe pneumonia -Blood cultures show no growth to date -Strep pneumonia urine antigen negative -CT abdomen and pelvis also noted airspace consolidation right lower lobe -Patient was placed on Levaquin however will discontinue in lieu of her diarrhea -Continue azithromycin and ceftriaxone -Daughter concern regarding Legionella, will obtain legionella urine antigen if possible -Tachycardia has improved, however patient does continue to have fevers, maximum temperature 101.8 -Will schedule nebs treatments, add flutter valve, incentive spirometry -will add on Tessalon Perles, Hycodan for cough  Hyponatremia -Sodium was 123 on admission, currently 132 -We will discontinue IV fluids and continue to monitor BMP -Currently patient has no neurological deficits  Diarrhea and nausea -Cdiff negative  -continue supportive care -CT abdomen and pelvis showed choledochocele in RUQ -will order zofran for nausea  -Continues to have some diarrhea, will add on Imodium as needed -will discontinue flagyl  Essential  hypertension -continue benazepril   Hypothyroidism -Continue synthroid  Vaginitis -Diagnosed by her primary care physician -will discontinue flagyl -Discussed symptoms with patient, patient was having more burning pain which has improved.  -UA was unremarkable -Patient may have vaginal dryness which could be causing her irritation as well.  Hyperlipidemia  -Continue statin   Vitamin D deficiency -Continue Vitamin D supplementation  Hypokalemia -Replaced, continue to monitor BMP  Insomnia -Suspect Hycodan will help patient rest at night, may also continue Benadryl as needed  DVT Prophylaxis  Lovenox  Code Status: Full  Family Communication: Husband and daughter at bedside   Disposition Plan: admitted. Possible discharge within 24-48 hours pending improvement  Consultants None  Procedures  None  Antibiotics   Anti-infectives    Start     Dose/Rate Route Frequency Ordered Stop   12/21/16 1000  Levofloxacin (LEVAQUIN) IVPB 250 mg  Status:  Discontinued     250 mg 50 mL/hr over 60 Minutes Intravenous Every 24 hours 12/19/16 1938 12/20/16 0851   12/20/16 1000  levofloxacin (LEVAQUIN) IVPB 500 mg  Status:  Discontinued     500 mg 100 mL/hr over 60 Minutes Intravenous Every 24 hours 12/19/16 1207 12/19/16 1935   12/20/16 1000  Levofloxacin (LEVAQUIN) IVPB 250 mg  Status:  Discontinued     250 mg 50 mL/hr over 60 Minutes Intravenous Every 24 hours 12/19/16 1935 12/19/16 1938   12/20/16 1000  levofloxacin (LEVAQUIN) IVPB 500 mg  Status:  Discontinued     500 mg 100 mL/hr over 60 Minutes Intravenous Every 24 hours 12/19/16 1938 12/20/16 0916   12/20/16 0930  cefTRIAXone (ROCEPHIN) 2  g in dextrose 5 % 50 mL IVPB     2 g 100 mL/hr over 30 Minutes Intravenous Every 24 hours 12/20/16 0926     12/20/16 0900  azithromycin (ZITHROMAX) 500 mg in dextrose 5 % 250 mL IVPB     500 mg 250 mL/hr over 60 Minutes Intravenous Every 24 hours 12/20/16 0851     12/19/16 1600   metroNIDAZOLE (FLAGYL) tablet 500 mg  Status:  Discontinued     500 mg Oral Every 8 hours 12/19/16 1257 12/21/16 0838   12/19/16 0845  azithromycin (ZITHROMAX) 500 mg in dextrose 5 % 250 mL IVPB     500 mg 250 mL/hr over 60 Minutes Intravenous  Once 12/19/16 0833 12/19/16 1057   12/19/16 0645  piperacillin-tazobactam (ZOSYN) IVPB 3.375 g     3.375 g 100 mL/hr over 30 Minutes Intravenous  Once 12/19/16 1610 12/19/16 0754      Subjective:   Judy Lindsey seen and examined today.  Patient continues to complain of feeling poorly overall. Feels everything hurts. Has been coughing, no sputum production. Currently denies chest pain, abdominal pain. Does complain of continued nausea and some diarrhea which is improving. Denies any dizziness or headache.  Objective:   Vitals:   12/20/16 2157 12/21/16 0522 12/21/16 0701 12/21/16 0843  BP: (!) 115/55 (!) 145/57    Pulse: 91 89    Resp: 20 (!) 24    Temp: 99.7 F (37.6 C) (!) 101.8 F (38.8 C) 99.4 F (37.4 C)   TempSrc: Oral Oral Oral   SpO2: 91% 94%  97%  Weight:      Height:        Intake/Output Summary (Last 24 hours) at 12/21/16 1303 Last data filed at 12/20/16 1649  Gross per 24 hour  Intake              300 ml  Output                0 ml  Net              300 ml   Filed Weights   12/19/16 0624  Weight: 63 kg (139 lb)    Exam  General: Well developed, well nourished, NAD, appears stated age  HEENT: NCAT,mucous membranes moist.   Cardiovascular: S1 S2 auscultated, no rubs, murmurs or gallops. Regular rate and rhythm.  Respiratory: Clear to auscultation bilaterally with equal chest rise   Abdomen: Soft, nontender, nondistended, + bowel sounds  Extremities: warm dry without cyanosis clubbing or edema  Neuro: AAOx3, nonfocal  Psych: Normal affect and demeanor with intact judgement and insight   Data Reviewed: I have personally reviewed following labs and imaging studies  CBC:  Recent Labs Lab 12/19/16 0619  12/20/16 0444 12/21/16 0404  WBC 6.3 3.1* 3.0*  NEUTROABS 5.5  --   --   HGB 12.1 10.4* 10.4*  HCT 34.8* 29.5* 31.0*  MCV 86.6 86.3 86.4  PLT 159 121* 140*   Basic Metabolic Panel:  Recent Labs Lab 12/19/16 0619 12/21/16 0404  NA 123* 132*  K 3.7 3.4*  CL 91* 101  CO2 18* 24  GLUCOSE 116* 102*  BUN 18 <5*  CREATININE 1.08* 0.46  CALCIUM 8.5* 7.6*   GFR: Estimated Creatinine Clearance: 49.2 mL/min (by C-G formula based on SCr of 0.46 mg/dL). Liver Function Tests:  Recent Labs Lab 12/19/16 0619  AST 33  ALT 15  ALKPHOS 42  BILITOT 1.1  PROT 6.0*  ALBUMIN 3.1*  No results for input(s): LIPASE, AMYLASE in the last 168 hours. No results for input(s): AMMONIA in the last 168 hours. Coagulation Profile: No results for input(s): INR, PROTIME in the last 168 hours. Cardiac Enzymes: No results for input(s): CKTOTAL, CKMB, CKMBINDEX, TROPONINI in the last 168 hours. BNP (last 3 results) No results for input(s): PROBNP in the last 8760 hours. HbA1C: No results for input(s): HGBA1C in the last 72 hours. CBG: No results for input(s): GLUCAP in the last 168 hours. Lipid Profile: No results for input(s): CHOL, HDL, LDLCALC, TRIG, CHOLHDL, LDLDIRECT in the last 72 hours. Thyroid Function Tests:  Recent Labs  12/19/16 1530  TSH 0.415   Anemia Panel: No results for input(s): VITAMINB12, FOLATE, FERRITIN, TIBC, IRON, RETICCTPCT in the last 72 hours. Urine analysis:    Component Value Date/Time   COLORURINE YELLOW 12/19/2016 1559   APPEARANCEUR CLEAR 12/19/2016 1559   LABSPEC 1.031 (H) 12/19/2016 1559   PHURINE 6.0 12/19/2016 1559   GLUCOSEU NEGATIVE 12/19/2016 1559   HGBUR NEGATIVE 12/19/2016 1559   BILIRUBINUR NEGATIVE 12/19/2016 1559   KETONESUR 20 (A) 12/19/2016 1559   PROTEINUR NEGATIVE 12/19/2016 1559   NITRITE NEGATIVE 12/19/2016 1559   LEUKOCYTESUR NEGATIVE 12/19/2016 1559   Sepsis Labs: @LABRCNTIP (procalcitonin:4,lacticidven:4)  ) Recent Results  (from the past 240 hour(s))  Blood culture (routine x 2)     Status: None (Preliminary result)   Collection Time: 12/19/16  6:19 AM  Result Value Ref Range Status   Specimen Description BLOOD RIGHT HAND  Final   Special Requests   Final    BOTTLES DRAWN AEROBIC AND ANAEROBIC Blood Culture adequate volume   Culture NO GROWTH 2 DAYS  Final   Report Status PENDING  Incomplete  Blood culture (routine x 2)     Status: None (Preliminary result)   Collection Time: 12/19/16  6:24 AM  Result Value Ref Range Status   Specimen Description BLOOD LEFT HAND  Final   Special Requests   Final    BOTTLES DRAWN AEROBIC AND ANAEROBIC Blood Culture adequate volume   Culture NO GROWTH 2 DAYS  Final   Report Status PENDING  Incomplete  C difficile quick scan w PCR reflex     Status: None   Collection Time: 12/19/16 11:20 AM  Result Value Ref Range Status   C Diff antigen NEGATIVE NEGATIVE Final   C Diff toxin NEGATIVE NEGATIVE Final   C Diff interpretation No C. difficile detected.  Final  Urine culture     Status: None   Collection Time: 12/19/16  4:00 PM  Result Value Ref Range Status   Specimen Description URINE, CLEAN CATCH  Final   Special Requests NONE  Final   Culture NO GROWTH  Final   Report Status 12/20/2016 FINAL  Final      Radiology Studies: No results found.   Scheduled Meds: . benzonatate  200 mg Oral TID  . enoxaparin (LOVENOX) injection  40 mg Subcutaneous Q24H  . feeding supplement (PRO-STAT SUGAR FREE 64)  30 mL Oral BID  . ipratropium-albuterol  3 mL Nebulization TID  . levothyroxine  100 mcg Oral QAC breakfast  . [START ON 12/22/2016] rosuvastatin  10 mg Oral Once per day on Mon Thu  . Vitamin D (Ergocalciferol)  50,000 Units Oral Q Sun   Continuous Infusions: . sodium chloride 75 mL/hr at 12/21/16 0510  . azithromycin Stopped (12/21/16 0911)  . cefTRIAXone (ROCEPHIN)  IV Stopped (12/21/16 1047)     LOS: 2 days  Time Spent in minutes   30 minutes  Fields Oros,  Yael Angerer D.O. on 12/21/2016 at 1:03 PM  Between 7am to 7pm - Pager - [phone]  After 7pm go to [email or web address] - password [email or web address]  And look for the night coverage person covering for me after hours  [facility] Office  [phone]

## 2016-12-22 ENCOUNTER — Inpatient Hospital Stay (HOSPITAL_COMMUNITY): Payer: Medicare PPO

## 2016-12-22 DIAGNOSIS — R0602 Shortness of breath: Secondary | ICD-10-CM

## 2016-12-22 DIAGNOSIS — R531 Weakness: Secondary | ICD-10-CM

## 2016-12-22 DIAGNOSIS — J9601 Acute respiratory failure with hypoxia: Secondary | ICD-10-CM

## 2016-12-22 LAB — RESPIRATORY PANEL BY PCR
Adenovirus: DETECTED — AB
Bordetella pertussis: NOT DETECTED
CORONAVIRUS NL63-RVPPCR: NOT DETECTED
CORONAVIRUS OC43-RVPPCR: NOT DETECTED
Chlamydophila pneumoniae: NOT DETECTED
Coronavirus 229E: NOT DETECTED
Coronavirus HKU1: NOT DETECTED
INFLUENZA A H1 2009-RVPPR: NOT DETECTED
INFLUENZA A H1-RVPPCR: NOT DETECTED
INFLUENZA B-RVPPCR: NOT DETECTED
Influenza A H3: NOT DETECTED
Influenza A: NOT DETECTED
METAPNEUMOVIRUS-RVPPCR: NOT DETECTED
MYCOPLASMA PNEUMONIAE-RVPPCR: NOT DETECTED
PARAINFLUENZA VIRUS 1-RVPPCR: NOT DETECTED
PARAINFLUENZA VIRUS 2-RVPPCR: NOT DETECTED
PARAINFLUENZA VIRUS 4-RVPPCR: NOT DETECTED
Parainfluenza Virus 3: NOT DETECTED
RESPIRATORY SYNCYTIAL VIRUS-RVPPCR: NOT DETECTED
Rhinovirus / Enterovirus: NOT DETECTED

## 2016-12-22 LAB — CBC WITH DIFFERENTIAL/PLATELET
BASOS ABS: 0 10*3/uL (ref 0.0–0.1)
BASOS PCT: 1 %
BASOS PCT: 1 %
Basophils Absolute: 0 10*3/uL (ref 0.0–0.1)
EOS PCT: 0 %
Eosinophils Absolute: 0 10*3/uL (ref 0.0–0.7)
Eosinophils Absolute: 0 10*3/uL (ref 0.0–0.7)
Eosinophils Relative: 0 %
HCT: 33.4 % — ABNORMAL LOW (ref 36.0–46.0)
HEMATOCRIT: 34.2 % — AB (ref 36.0–46.0)
HEMOGLOBIN: 11.5 g/dL — AB (ref 12.0–15.0)
HEMOGLOBIN: 11 g/dL — AB (ref 12.0–15.0)
LYMPHS ABS: 0.8 10*3/uL (ref 0.7–4.0)
LYMPHS PCT: 14 %
Lymphocytes Relative: 21 %
Lymphs Abs: 0.6 10*3/uL — ABNORMAL LOW (ref 0.7–4.0)
MCH: 29.9 pg (ref 26.0–34.0)
MCH: 28.9 pg (ref 26.0–34.0)
MCHC: 33.6 g/dL (ref 30.0–36.0)
MCHC: 32.9 g/dL (ref 30.0–36.0)
MCV: 88.8 fL (ref 78.0–100.0)
MCV: 87.9 fL (ref 78.0–100.0)
MONOS PCT: 4 %
Monocytes Absolute: 0.2 10*3/uL (ref 0.1–1.0)
Monocytes Absolute: 0.1 10*3/uL (ref 0.1–1.0)
Monocytes Relative: 2 %
NEUTROS ABS: 2.8 10*3/uL (ref 1.7–7.7)
NEUTROS ABS: 3.8 10*3/uL (ref 1.7–7.7)
NEUTROS PCT: 83 %
Neutrophils Relative %: 74 %
Platelets: 177 10*3/uL (ref 150–400)
Platelets: 189 10*3/uL (ref 150–400)
RBC: 3.85 MIL/uL — ABNORMAL LOW (ref 3.87–5.11)
RBC: 3.8 MIL/uL — ABNORMAL LOW (ref 3.87–5.11)
RDW: 14.1 % (ref 11.5–15.5)
RDW: 13.6 % (ref 11.5–15.5)
WBC: 3.8 10*3/uL — ABNORMAL LOW (ref 4.0–10.5)
WBC: 4.5 10*3/uL (ref 4.0–10.5)

## 2016-12-22 LAB — BASIC METABOLIC PANEL
Anion gap: 10 (ref 5–15)
CO2: 25 mmol/L (ref 22–32)
Calcium: 7.8 mg/dL — ABNORMAL LOW (ref 8.9–10.3)
Chloride: 98 mmol/L — ABNORMAL LOW (ref 101–111)
Creatinine, Ser: 0.51 mg/dL (ref 0.44–1.00)
GFR calc Af Amer: >60 mL/min (ref >60)
GLUCOSE: 104 mg/dL — AB (ref 65–99)
POTASSIUM: 3.6 mmol/L (ref 3.5–5.1)
Sodium: 133 mmol/L — ABNORMAL LOW (ref 135–145)

## 2016-12-22 LAB — PHOSPHORUS: Phosphorus: 1.6 mg/dL — ABNORMAL LOW (ref 2.5–4.6)

## 2016-12-22 LAB — BRAIN NATRIURETIC PEPTIDE: B Natriuretic Peptide: 162.3 pg/mL — ABNORMAL HIGH (ref 0.0–100.0)

## 2016-12-22 LAB — LEGIONELLA PNEUMOPHILA SEROGP 1 UR AG: L. pneumophila Serogp 1 Ur Ag: NEGATIVE

## 2016-12-22 LAB — MAGNESIUM: MAGNESIUM: 1.6 mg/dL — AB (ref 1.7–2.4)

## 2016-12-22 MED ORDER — MAGNESIUM SULFATE 2 GM/50ML IV SOLN
2.0000 g | INTRAVENOUS | Status: AC
  Administered 2016-12-22 (×2): 2 g via INTRAVENOUS
  Filled 2016-12-22 (×2): qty 50

## 2016-12-22 MED ORDER — POTASSIUM PHOSPHATES 15 MMOLE/5ML IV SOLN
20.0000 mmol | Freq: Once | INTRAVENOUS | Status: AC
  Administered 2016-12-22: 20 mmol via INTRAVENOUS
  Filled 2016-12-22: qty 6.67

## 2016-12-22 MED ORDER — METHYLPREDNISOLONE SODIUM SUCC 125 MG IJ SOLR
125.0000 mg | Freq: Once | INTRAMUSCULAR | Status: AC
  Administered 2016-12-22: 125 mg via INTRAVENOUS
  Filled 2016-12-22: qty 2

## 2016-12-22 MED ORDER — ENSURE ENLIVE PO LIQD
237.0000 mL | Freq: Two times a day (BID) | ORAL | Status: DC
  Administered 2016-12-22: 237 mL via ORAL

## 2016-12-22 NOTE — Progress Notes (Signed)
Pt's O2 on RA dropped to mid 80's.1L O2 & PRN Neb treatment were given. O2 came up to mid 90s. Weaning put her back in mid 7880s. 1L O2 was replaced and saturation went back up to the mid 90s. MD on call was notified. He ordered some labs and Solumedrol. Will complete the orders and continue to monitor.

## 2016-12-22 NOTE — Consult Note (Signed)
[facility] CM Primary Care Navigator  12/22/2016  Judy Lindsey Dec 05, 1935 [id number]   Met with patient, husband Judy Lindsey) and daughter Judy Lindsey) at the bedside to identify possible discharge needs. Daughter reports that patient was having  "high fever, "weakness", cough, diarrhea and vomiting that had led to this admission.  Patient endorses Dr. Kelton Pillar with [place] [facility] as her primary care provider.   Patient states using [facility] on [address] and [facility] service to obtain medications without any problem.   Patient is managing her own medications at home using "pill box" system filled weekly.   Patient was driving prior to admission but husband does most of the driving per patient. Husband will be providing transportation to her doctors' appointments after discharge.  [employer] transportation benefits discussed with patient and family as well.  Her husband and daughter will be the primary caregivers at home as stated.   Discharge plan is home when better according to patient.  Patient and family voiced understanding to call primary care provider's office for a post discharge follow-up appointment within a week or sooner if needs arise. Patient letter (with PCP's contact number) was provided as their reminder.          Explained to patient and familyregardingTHN CM services available for health management.  Patient opted and verbally agreed with [facility] tofollow her up with her recovery at home.   Referral to [facility] made to follow-up patient after discharge.  [facility] care management information provided for future needs that she may have.  For questions, please contact:  Dannielle Huh, BSN, RN- [facility] Primary Care Navigator  Telephone: [phone] [place]

## 2016-12-22 NOTE — Progress Notes (Signed)
PROGRESS NOTE    Judy Lindsey  ZOX:[id number] DOB: Sep 29, 1935 DOA: 12/19/2016 PCP: Maurice Small, MD   Chief Complaint  Patient presents with  . Fever  . Diarrhea  . Weakness    Brief Narrative:  HPI On 12/19/2016 by Dr. Pearson Grippe Judy Lindsey is a 81 y.o. female, w hypertension, hypothyroidism, apparently present from home with fever intermittent for about 1 week.  Slight cough.  Slight sob, generalized weakness. + diarrhea,  No recent travel or odd food eaten.  Tried immodium and helped some .  Pt was seen on Wednesday,  T99, and ua negative, but some  vaginal infection.   Pt presented to ED for evaluation.   In ED  Wbc 6.3,  Hgb 12.1 ,  Na 123 , CXR +> + pneumonia RLL   Pt will be admitted for pneumonia and diarrrrhea.    Interim history Dyspnea not improving. Patient continues to have fevers. Will check respiratory viral panel.  Assessment & Plan   Sepsis and dyspnea secondary Community Acquired pneumonia/Acute respiratory failure with hypoxia -Presented with fever, tachycardia, tachypnea -CXR right lower lobe pneumonia -Blood cultures show no growth to date -Strep pneumonia urine antigen negative -CT abdomen and pelvis also noted airspace consolidation right lower lobe -Patient was placed on Levaquin however will discontinue in lieu of her diarrhea -Continue azithromycin and ceftriaxone -Legionella urine antigen Pending -Tachycardia has improved, however patient does continue to have fevers, maximum temperature 100F -The patient continues to spike fevers and respiratory status is not improving, will obtain a respiratory viral panel -Continue intensive spirometry, flutter valve, antitussives, neb treatments -Overnight, patient did become hypoxic (O2 86%) and required supplemental oxygen, will continue O2 -Repeated chest x-ray, which shows right lower lobe pneumonia  Hyponatremia -Sodium was 123 on admission, currently 133 -Improving, continue to monitor BMP -Currently  patient has no neurological deficits  Diarrhea and nausea -Cdiff negative  -continue supportive care -CT abdomen and pelvis showed choledochocele in RUQ -Continues to have some diarrhea -Discontinue Flagyl, nausea is mildly improving -Continue Imodium and antiemetics as needed  Essential hypertension -continue benazepril   Hypothyroidism -Continue synthroid  Vaginitis -Diagnosed by her primary care physician -Flagyl discontinued -Discussed symptoms with patient, patient was having more burning pain which has improved.  -UA was unremarkable -Patient may have vaginal dryness which could be causing her irritation as well.  Hyperlipidemia  -Continue statin   Vitamin D deficiency -Continue Vitamin D supplementation  Hypokalemia -Replaced, continue to monitor BMP  Insomnia -Suspect Hycodan will help patient rest at night, may also continue Benadryl as needed  Poor oral intake -Suspect secondary to sepsis and infection -Will order ensure  Generalized weakness -Suspect secondary to sepsis and pneumonia, will order PT  DVT Prophylaxis  Lovenox  Code Status: Full  Family Communication: Family at bedside  Disposition Plan: admitted. Pending improvement in respiratory status  Consultants None  Procedures  None  Antibiotics   Anti-infectives    Start     Dose/Rate Route Frequency Ordered Stop   12/21/16 1000  Levofloxacin (LEVAQUIN) IVPB 250 mg  Status:  Discontinued     250 mg 50 mL/hr over 60 Minutes Intravenous Every 24 hours 12/19/16 1938 12/20/16 0851   12/20/16 1000  levofloxacin (LEVAQUIN) IVPB 500 mg  Status:  Discontinued     500 mg 100 mL/hr over 60 Minutes Intravenous Every 24 hours 12/19/16 1207 12/19/16 1935   12/20/16 1000  Levofloxacin (LEVAQUIN) IVPB 250 mg  Status:  Discontinued  250 mg 50 mL/hr over 60 Minutes Intravenous Every 24 hours 12/19/16 1935 12/19/16 1938   12/20/16 1000  levofloxacin (LEVAQUIN) IVPB 500 mg  Status:  Discontinued       500 mg 100 mL/hr over 60 Minutes Intravenous Every 24 hours 12/19/16 1938 12/20/16 0916   12/20/16 0930  cefTRIAXone (ROCEPHIN) 2 g in dextrose 5 % 50 mL IVPB     2 g 100 mL/hr over 30 Minutes Intravenous Every 24 hours 12/20/16 0926     12/20/16 0900  azithromycin (ZITHROMAX) 500 mg in dextrose 5 % 250 mL IVPB     500 mg 250 mL/hr over 60 Minutes Intravenous Every 24 hours 12/20/16 0851     12/19/16 1600  metroNIDAZOLE (FLAGYL) tablet 500 mg  Status:  Discontinued     500 mg Oral Every 8 hours 12/19/16 1257 12/21/16 0838   12/19/16 0845  azithromycin (ZITHROMAX) 500 mg in dextrose 5 % 250 mL IVPB     500 mg 250 mL/hr over 60 Minutes Intravenous  Once 12/19/16 0833 12/19/16 1057   12/19/16 0645  piperacillin-tazobactam (ZOSYN) IVPB 3.375 g     3.375 g 100 mL/hr over 30 Minutes Intravenous  Once 12/19/16 1610 12/19/16 0754      Subjective:   Judy Lindsey seen and examined today.  Continues to feel poorly. Feels cough has improved mildly. Continues to have shortness of breath and requiring oxygen at this time. Was able to sleep more overnight. Continues to have fevers. Nausea mildly improving. Continues to have diarrhea however also improving. Currently denies chest pain, abdominal pain, vomiting, constipation, dizziness or headache.  Objective:   Vitals:   12/22/16 0215 12/22/16 0255 12/22/16 0553 12/22/16 0935  BP:   140/73   Pulse:   92 88  Resp:   18 18  Temp:   100 F (37.8 C)   TempSrc:   Oral   SpO2: (!) 86% 95% 93% 97%  Weight:      Height:       No intake or output data in the 24 hours ending 12/22/16 1252 Filed Weights   12/19/16 0624  Weight: 63 kg (139 lb)   Exam  General: Well developed, well nourished, NAD, appears stated age  HEENT: NCAT, mucous membranes moist.   Cardiovascular: S1 S2 auscultated, RRR, no murmurs  Respiratory: diminished breath sounds, +Cough  Abdomen: Soft, nontender, nondistended, + bowel sounds  Extremities: warm dry without  cyanosis clubbing or edema  Neuro: AAOx3, nonfocal  Psych: Appropriate mood and affect   Data Reviewed: I have personally reviewed following labs and imaging studies  CBC:  Recent Labs Lab 12/19/16 0619 12/20/16 0444 12/21/16 0404 12/22/16 0322 12/22/16 0944  WBC 6.3 3.1* 3.0* 3.8* 4.5  NEUTROABS 5.5  --   --  2.8 3.8  HGB 12.1 10.4* 10.4* 11.5* 11.0*  HCT 34.8* 29.5* 31.0* 34.2* 33.4*  MCV 86.6 86.3 86.4 88.8 87.9  PLT 159 121* 140* 177 189   Basic Metabolic Panel:  Recent Labs Lab 12/19/16 0619 12/21/16 0404 12/22/16 0311  NA 123* 132* 133*  K 3.7 3.4* 3.6  CL 91* 101 98*  CO2 18* 24 25  GLUCOSE 116* 102* 104*  BUN 18 <5* <5*  CREATININE 1.08* 0.46 0.51  CALCIUM 8.5* 7.6* 7.8*  MG  --   --  1.6*  PHOS  --   --  1.6*   GFR: Estimated Creatinine Clearance: 49.2 mL/min (by C-G formula based on SCr of 0.51 mg/dL). Liver Function Tests:  Recent Labs Lab 12/19/16 0619  AST 33  ALT 15  ALKPHOS 42  BILITOT 1.1  PROT 6.0*  ALBUMIN 3.1*   No results for input(s): LIPASE, AMYLASE in the last 168 hours. No results for input(s): AMMONIA in the last 168 hours. Coagulation Profile: No results for input(s): INR, PROTIME in the last 168 hours. Cardiac Enzymes: No results for input(s): CKTOTAL, CKMB, CKMBINDEX, TROPONINI in the last 168 hours. BNP (last 3 results) No results for input(s): PROBNP in the last 8760 hours. HbA1C: No results for input(s): HGBA1C in the last 72 hours. CBG: No results for input(s): GLUCAP in the last 168 hours. Lipid Profile: No results for input(s): CHOL, HDL, LDLCALC, TRIG, CHOLHDL, LDLDIRECT in the last 72 hours. Thyroid Function Tests:  Recent Labs  12/19/16 1530  TSH 0.415   Anemia Panel: No results for input(s): VITAMINB12, FOLATE, FERRITIN, TIBC, IRON, RETICCTPCT in the last 72 hours. Urine analysis:    Component Value Date/Time   COLORURINE YELLOW 12/19/2016 1559   APPEARANCEUR CLEAR 12/19/2016 1559   LABSPEC  1.031 (H) 12/19/2016 1559   PHURINE 6.0 12/19/2016 1559   GLUCOSEU NEGATIVE 12/19/2016 1559   HGBUR NEGATIVE 12/19/2016 1559   BILIRUBINUR NEGATIVE 12/19/2016 1559   KETONESUR 20 (A) 12/19/2016 1559   PROTEINUR NEGATIVE 12/19/2016 1559   NITRITE NEGATIVE 12/19/2016 1559   LEUKOCYTESUR NEGATIVE 12/19/2016 1559   Sepsis Labs: @LABRCNTIP (procalcitonin:4,lacticidven:4)  ) Recent Results (from the past 240 hour(s))  Blood culture (routine x 2)     Status: None (Preliminary result)   Collection Time: 12/19/16  6:19 AM  Result Value Ref Range Status   Specimen Description BLOOD RIGHT HAND  Final   Special Requests   Final    BOTTLES DRAWN AEROBIC AND ANAEROBIC Blood Culture adequate volume   Culture NO GROWTH 2 DAYS  Final   Report Status PENDING  Incomplete  Blood culture (routine x 2)     Status: None (Preliminary result)   Collection Time: 12/19/16  6:24 AM  Result Value Ref Range Status   Specimen Description BLOOD LEFT HAND  Final   Special Requests   Final    BOTTLES DRAWN AEROBIC AND ANAEROBIC Blood Culture adequate volume   Culture NO GROWTH 2 DAYS  Final   Report Status PENDING  Incomplete  C difficile quick scan w PCR reflex     Status: None   Collection Time: 12/19/16 11:20 AM  Result Value Ref Range Status   C Diff antigen NEGATIVE NEGATIVE Final   C Diff toxin NEGATIVE NEGATIVE Final   C Diff interpretation No C. difficile detected.  Final  Urine culture     Status: None   Collection Time: 12/19/16  4:00 PM  Result Value Ref Range Status   Specimen Description URINE, CLEAN CATCH  Final   Special Requests NONE  Final   Culture NO GROWTH  Final   Report Status 12/20/2016 FINAL  Final      Radiology Studies: Dg Chest 2 View  Result Date: 12/22/2016 CLINICAL DATA:  Productive cough.  Fever.  Shortness of breath. EXAM: CHEST  2 VIEW COMPARISON:  12/19/2016. FINDINGS: The heart size and mediastinal contours are within normal limits. Increased opacity in the medial  right lung base obscures the right heart border and is suggestive of a right middle lobe infiltrate. The left lung is clear. No sizable pleural effusions or pneumothorax. The visualized skeletal structures are unremarkable. Cholecystectomy clips noted in the right upper quadrant. IMPRESSION: Interval development of a right  middle lobe infiltrate. Follow-up to resolution recommended. Electronically Signed   By: Sande Brothers M.D.   On: 12/22/2016 09:00     Scheduled Meds: . benzonatate  200 mg Oral TID  . enoxaparin (LOVENOX) injection  40 mg Subcutaneous Q24H  . feeding supplement (ENSURE ENLIVE)  237 mL Oral BID BM  . feeding supplement (PRO-STAT SUGAR FREE 64)  30 mL Oral BID  . ipratropium-albuterol  3 mL Nebulization TID  . levothyroxine  100 mcg Oral QAC breakfast  . rosuvastatin  10 mg Oral Once per day on Mon Thu  . Vitamin D (Ergocalciferol)  50,000 Units Oral Q Sun   Continuous Infusions: . azithromycin Stopped (12/22/16 1135)  . cefTRIAXone (ROCEPHIN)  IV Stopped (12/22/16 1005)  . potassium PHOSPHATE IVPB (mmol) 20 mmol (12/22/16 1210)     LOS: 3 days   Time Spent in minutes   30 minutes  Bonnell Placzek D.O. on 12/22/2016 at 12:52 PM  Between 7am to 7pm - Pager - [phone]  After 7pm go to [email or web address] - password [email or web address]  And look for the night coverage person covering for me after hours  [facility] Office  [phone]

## 2016-12-23 DIAGNOSIS — J9621 Acute and chronic respiratory failure with hypoxia: Secondary | ICD-10-CM

## 2016-12-23 DIAGNOSIS — J120 Adenoviral pneumonia: Secondary | ICD-10-CM

## 2016-12-23 LAB — BASIC METABOLIC PANEL
ANION GAP: 9 (ref 5–15)
BUN: <5 mg/dL — ABNORMAL LOW (ref 6–20)
CALCIUM: 7.6 mg/dL — AB (ref 8.9–10.3)
CO2: 28 mmol/L (ref 22–32)
CREATININE: 0.43 mg/dL — AB (ref 0.44–1.00)
Chloride: 98 mmol/L — ABNORMAL LOW (ref 101–111)
GFR calc Af Amer: >60 mL/min (ref >60)
GFR calc non Af Amer: >60 mL/min (ref >60)
GLUCOSE: 150 mg/dL — AB (ref 65–99)
Potassium: 5.4 mmol/L — ABNORMAL HIGH (ref 3.5–5.1)
Sodium: 135 mmol/L (ref 135–145)

## 2016-12-23 LAB — CBC
HEMATOCRIT: 31.7 % — AB (ref 36.0–46.0)
Hemoglobin: 10.4 g/dL — ABNORMAL LOW (ref 12.0–15.0)
MCH: 29.6 pg (ref 26.0–34.0)
MCHC: 32.8 g/dL (ref 30.0–36.0)
MCV: 90.3 fL (ref 78.0–100.0)
Platelets: 206 10*3/uL (ref 150–400)
RBC: 3.51 MIL/uL — ABNORMAL LOW (ref 3.87–5.11)
RDW: 14.1 % (ref 11.5–15.5)
WBC: 3.9 10*3/uL — ABNORMAL LOW (ref 4.0–10.5)

## 2016-12-23 LAB — LEGIONELLA PNEUMOPHILA SEROGP 1 UR AG: L. pneumophila Serogp 1 Ur Ag: NEGATIVE

## 2016-12-23 LAB — POTASSIUM: Potassium: 3.9 mmol/L (ref 3.5–5.1)

## 2016-12-23 MED ORDER — PROAIR HFA 108 (90 BASE) MCG/ACT IN AERS: 2.0000 | INHALATION_SPRAY | RESPIRATORY_TRACT | 0 refills | Status: DC | PRN

## 2016-12-23 MED ORDER — ENSURE ENLIVE PO LIQD: 237.0000 mL | Freq: Two times a day (BID) | ORAL | 0 refills | Status: DC

## 2016-12-23 MED ORDER — HYDROCODONE-HOMATROPINE 5-1.5 MG/5ML PO SYRP
5.0000 mL | ORAL_SOLUTION | Freq: Four times a day (QID) | ORAL | 0 refills | Status: DC | PRN
Start: 2016-12-23 — End: 2018-04-05

## 2016-12-23 MED ORDER — BENZONATATE 200 MG PO CAPS: 200.0000 mg | ORAL_CAPSULE | Freq: Three times a day (TID) | ORAL | 0 refills | Status: DC

## 2016-12-23 NOTE — Progress Notes (Signed)
SATURATION QUALIFICATIONS: (This note is used to comply with regulatory documentation for home oxygen)  Patient Saturations on Room Air at Rest = 92%  Patient Saturations on Room Air while Ambulating = 90%  Pt walked approx 300 feet on RA, did not c/o SOB or lightheadedness. Only complaint was knee pain.   Please briefly explain why patient needs home oxygen: Pt does not qualify for home o2.

## 2016-12-23 NOTE — Evaluation (Signed)
Physical Therapy Evaluation Patient Details Name: Judy Lindsey MRN: [id number] DOB: 05/17/1935 Today's Date: 12/23/2016   History of Present Illness  pt is a 81 y/o femal with pmh of HTN, TKA, admitted with 1 week h/o fever, sob, generalized weakness and diarrhea.  Workup found CAP and positive for adenovirus.  Clinical Impression  Pt is at or close to baseline functioning and should be safe at home with available assist. There are no further acute PT needs.  Will sign off at this time.     Follow Up Recommendations No PT follow up    Equipment Recommendations  None recommended by PT    Recommendations for Other Services       Precautions / Restrictions Precautions Precautions: Other (comment) (minimal fall risk)      Mobility  Bed Mobility Overal bed mobility: Modified Independent                Transfers Overall transfer level: Modified independent                  Ambulation/Gait Ambulation/Gait assistance: Supervision Ambulation Distance (Feet): 250 Feet Assistive device: None Gait Pattern/deviations: Step-through pattern     General Gait Details: functional and steady.  Accepted moderate challenge to balance including spped changes, stepping over obstacles, scanning, abrupt directional changes without any overt LOB or deviation  Stairs            Wheelchair Mobility    Modified Rankin (Stroke Patients Only)       Balance Overall balance assessment: Needs assistance Sitting-balance support: No upper extremity supported Sitting balance-Leahy Scale: Normal     Standing balance support: No upper extremity supported Standing balance-Leahy Scale: Good                               Pertinent Vitals/Pain Pain Assessment: No/denies pain    Home Living Family/patient expects to be discharged to:: Private residence Living Arrangements: Spouse/significant other;Children;Other relatives Available Help at Discharge:  Family;Available 24 hours/day Type of Home: House       Home Layout: Multi-level;Other (Comment) (pt/husband live in the daylight basement, built accessible) Home Equipment: Walker - 2 wheels;Bedside commode;Cane - single point      Prior Function Level of Independence: Independent               Hand Dominance        Extremity/Trunk Assessment   Upper Extremity Assessment Upper Extremity Assessment: Overall WFL for tasks assessed    Lower Extremity Assessment Lower Extremity Assessment: Overall WFL for tasks assessed (bil proximal weakness--hams, hip flexors, quads)    Cervical / Trunk Assessment Cervical / Trunk Assessment: Normal  Communication   Communication: No difficulties  Cognition Arousal/Alertness: Awake/alert Behavior During Therapy: WFL for tasks assessed/performed Overall Cognitive Status: Within Functional Limits for tasks assessed                                        General Comments General comments (skin integrity, edema, etc.): During gait sats 90% on RA and EHR 85 bpm    Exercises     Assessment/Plan    PT Assessment Patent does not need any further PT services  PT Problem List         PT Treatment Interventions      PT Goals (Current goals can be found  in the Care Plan section)  Acute Rehab PT Goals PT Goal Formulation: All assessment and education complete, DC therapy    Frequency     Barriers to discharge        Co-evaluation               AM-PAC PT "6 Clicks" Daily Activity  Outcome Measure Difficulty turning over in bed (including adjusting bedclothes, sheets and blankets)?: A Little Difficulty moving from lying on back to sitting on the side of the bed? : A Little Difficulty sitting down on and standing up from a chair with arms (e.g., wheelchair, bedside commode, etc,.)?: None Help needed moving to and from a bed to chair (including a wheelchair)?: None Help needed walking in hospital room?:  None Help needed climbing 3-5 steps with a railing? : A Little 6 Click Score: 21    End of Session   Activity Tolerance: Patient tolerated treatment well Patient left: with call bell/phone within reach;with bed alarm set;Other (comment) (sitting EOB) Nurse Communication: Mobility status PT Visit Diagnosis: Unsteadiness on feet (R26.81);Other abnormalities of gait and mobility (R26.89)    Time: [id number] PT Time Calculation (min) (ACUTE ONLY): 16 min   Charges:   PT Evaluation $PT Eval Low Complexity: 1 Low     PT G Codes:        12/23/2016  [place] BingKen Meadow Abramo, PT [phone]  (pager)  Eliseo GumKenneth V Aravind Chrismer 12/23/2016, 1:02 PM

## 2016-12-23 NOTE — Discharge Summary (Signed)
Physician Discharge Summary  Judy Lindsey:[id number] DOB: 1935-09-09 DOA: 79/02/2017  PCP: Maurice Small, MD  Admit date: 12/19/2016 Discharge date: 12/23/2016  Time spent: 45 minutes  Recommendations for Outpatient Follow-up:  Patient will be discharged to home.  Patient will need to follow up with primary care provider within one week of discharge, discuss overnight pulse oximeter testing.  Patient should continue medications as prescribed.  Patient should follow a regular diet.   Discharge Diagnoses:  Sepsis and dyspnea secondary Community Acquired pneumonia/Acute respiratory failure with hypoxia Hyponatremia Diarrhea and nausea Essential hypertension Hypothyroidism Vaginitis Hyperlipidemia  Vitamin D deficiency Hypokalemia Insomnia Poor oral intake Generalized weakness  Discharge Condition: Stable  Diet recommendation: regular  Filed Weights   12/19/16 0624  Weight: 63 kg (139 lb)    History of present illness:  On 12/19/2016 by Dr. Pearson Grippe [name] a 81 y.o.female,w hypertension, hypothyroidism, apparently present from home with fever intermittent for about 1 week. Slight cough. Slight sob, generalized weakness. + diarrhea, No recent travel or odd food eaten. Tried immodium and helped some . Pt was seen on Wednesday, T99, and ua negative, but some vaginal infection. Pt presented to ED for evaluation.  In ED Wbc 6.3, Hgb 12.1 , Na 123 , CXR +>+ pneumonia RLL  Pt will be admitted for pneumonia and diarrrrhea.   Hospital Course:  Sepsis and dyspnea secondary Community Acquired pneumonia/Acute respiratory failure with hypoxia -Presented with fever, tachycardia, tachypnea -CXR right lower lobe pneumonia -Blood cultures show no growth to date -Strep pneumonia and legionella urine antigens negative -CT abdomen and pelvis also noted airspace consolidation right lower lobe -Patient was placed on Levaquin however will discontinue in lieu of her  diarrhea -Was placed on azithromycin and ceftriaxone, however patient continued to have fevers over the past several days.  -Obtained respiratory viral panel: +Adenovirus -Discontinued antibiotics  -Discussed with patient and family to continue fluid hydration -Would repeat CXR in 2-3 weeks to ensure resolution of pneumonia -Patient also had some hypoxia (O2 sats fell to 86%). -patient was able to ambulate and maintain her O2 sats in the 90s -Discussed possible overnight pulse oximetry testing which can be ordered by her PCP if she decides to do this in the future -Will discharge patient with neb/meds, tessalon, hycodan -Currently, afebrile, no leukocytosis or tachycardia/tachypnea  Hyponatremia -Sodium was 123 on admission, currently 135 -Resolved with IVF -Currently patient has no neurological deficits  Diarrhea and nausea -Cdiff negative  -continue supportive care -CT abdomen and pelvis showed choledochocele in RUQ -Continues to have some diarrhea -Discontinued Flagyl, nausea and diarrhea improving  -Continue Imodium and antiemetics as needed  Essential hypertension -continue benazepril   Hypothyroidism -Continue synthroid  Vaginitis -Diagnosed by her primary care physician -Flagyl discontinued -Discussed symptoms with patient, patient was having more burning pain which has improved.  -UA was unremarkable -Patient may have vaginal dryness which could be causing her irritation as well.  Hyperlipidemia  -Continue statin   Vitamin D deficiency -Continue Vitamin D supplementation  Hypokalemia -Replaced, repeat BMP in one week  Insomnia -Continue hycodan to help with sleep and cough  Poor oral intake -Suspect secondary to sepsis and infection -Continue nutritional supplements  Generalized weakness -Suspect secondary to sepsis and pneumonia  Procedures: None  Consultations: None  Discharge Exam: Vitals:   12/23/16 0554 12/23/16 0821  BP:  133/62   Pulse: 72   Resp: 18   Temp: (!) 97.5 F (36.4 C)   SpO2: 96% 96%   Patient feeling  much better today. Feels breathing and cough have improved. Continues to have some diarrhea. Denies chest pain. Denies abdominal pain, nausea, dizziness, headache.    General: Well developed, well nourished, NAD, appears stated age  HEENT: NCAT, mucous membranes moist.  Cardiovascular: S1 S2 auscultated, 2/6SEM, RRR  Respiratory: Clear to auscultation bilaterally with equal chest rise, +dry cough  Abdomen: Soft, nontender, nondistended, + bowel sounds  Extremities: warm dry without cyanosis clubbing or edema  Neuro: AAOx3, nonfocal  Psych: Normal affect and demeanor with intact judgement and insight, pleasant  Discharge Instructions Discharge Instructions    DME Nebulizer machine    Complete by:  As directed    Patient needs a nebulizer to treat with the following condition:  Acute respiratory failure (HCC)   DME Nebulizer/meds    Complete by:  As directed    Patient needs a nebulizer to treat with the following condition:  Acute respiratory failure (HCC)   Discharge instructions    Complete by:  As directed    Patient will be discharged to home.  Patient will need to follow up with primary care provider within one week of discharge, discuss overnight pulse oximeter testing.  Patient should continue medications as prescribed.  Patient should follow a regular diet.     Current Discharge Medication List    START taking these medications   Details  benzonatate (TESSALON) 200 MG capsule Take 1 capsule (200 mg total) by mouth 3 (three) times daily. Qty: 20 capsule, Refills: 0    feeding supplement, ENSURE ENLIVE, (ENSURE ENLIVE) LIQD Take 237 mLs by mouth 2 (two) times daily between meals. Qty: 60 Bottle, Refills: 0    HYDROcodone-homatropine (HYCODAN) 5-1.5 MG/5ML syrup Take 5 mLs by mouth every 6 (six) hours as needed for cough. Qty: 120 mL, Refills: 0      CONTINUE these  medications which have CHANGED   Details  PROAIR HFA 108 (90 Base) MCG/ACT inhaler Inhale 2 puffs into the lungs every 4 (four) hours as needed for wheezing. Qty: 18 g, Refills: 0      CONTINUE these medications which have NOT CHANGED   Details  acetaminophen (TYLENOL) 500 MG tablet Take 2 tablets (1,000 mg total) by mouth every 8 (eight) hours. Qty: 30 tablet, Refills: 0    benazepril (LOTENSIN) 10 MG tablet Take 10 mg by mouth daily.  Refills: 0    betamethasone dipropionate (DIPROLENE) 0.05 % cream Apply 1 application topically daily as needed (eczema).  Refills: 0    celecoxib (CELEBREX) 200 MG capsule Take 200mg  by mouth once daily if needed for pain Refills: 0    polyethylene glycol (MIRALAX / GLYCOLAX) packet Take 17 g by mouth 2 (two) times daily. Qty: 14 each, Refills: 0    promethazine (PHENERGAN) 25 MG tablet Take 25 mg by mouth 3 (three) times daily as needed for nausea. Refills: 1    rosuvastatin (CRESTOR) 10 MG tablet Take 10 mg by mouth 2 (two) times a week.  Refills: 0    SYNTHROID 100 MCG tablet Take 100 mcg by mouth daily before breakfast.  Refills: 0    traMADol (ULTRAM) 50 MG tablet Take 50 mg by mouth every 6 (six) hours as needed for moderate pain.    Vitamin D, Ergocalciferol, (DRISDOL) 50000 units CAPS capsule TAKE 1 CAP BY MOUTH ONCE WEEKLY ON SUNDAYS Refills: 0    ferrous sulfate 325 (65 FE) MG tablet Take 1 tablet (325 mg total) by mouth 3 (three) times daily after meals. Refills:  3      STOP taking these medications     aspirin EC 325 MG EC tablet      docusate sodium (COLACE) 100 MG capsule      oxyCODONE (OXY IR/ROXICODONE) 5 MG immediate release tablet      tiZANidine (ZANAFLEX) 4 MG tablet        Allergies  Allergen Reactions  . Codeine Nausea And Vomiting  . Nubain [Nalbuphine] Nausea And Vomiting   Follow-up Information    Maurice SmallGriffin, Elaine, MD. Schedule an appointment as soon as possible for a visit in 1 week(s).     Specialty:  Family Medicine Why:  Hospital follow up Contact information: [address] [place] [address] [phone]            The results of significant diagnostics from this hospitalization (including imaging, microbiology, ancillary and laboratory) are listed below for reference.    Significant Diagnostic Studies: Dg Chest 2 View  Result Date: 12/22/2016 CLINICAL DATA:  Productive cough.  Fever.  Shortness of breath. EXAM: CHEST  2 VIEW COMPARISON:  12/19/2016. FINDINGS: The heart size and mediastinal contours are within normal limits. Increased opacity in the medial right lung base obscures the right heart border and is suggestive of a right middle lobe infiltrate. The left lung is clear. No sizable pleural effusions or pneumothorax. The visualized skeletal structures are unremarkable. Cholecystectomy clips noted in the right upper quadrant. IMPRESSION: Interval development of a right middle lobe infiltrate. Follow-up to resolution recommended. Electronically Signed   By: Sande BrothersSerena  Chacko M.D.   On: 12/22/2016 09:00   Dg Chest 2 View  Result Date: 12/19/2016 CLINICAL DATA:  Fever.  Weakness. EXAM: CHEST  2 VIEW COMPARISON:  05/17/2009 FINDINGS: Normal heart size. Right posterior lung base opacity is identified compatible with pneumonia. Left lung is clear. No pleural effusion or edema. IMPRESSION: Right lower lobe pneumonia. Followup PA and lateral chest X-ray is recommended in 3-4 weeks following trial of antibiotic therapy to ensure resolution and exclude underlying malignancy. Electronically Signed   By: Signa Kellaylor  Stroud M.D.   On: 12/19/2016 08:23   Ct Abdomen Pelvis W Contrast  Result Date: 12/19/2016 CLINICAL DATA:  Abdominal pain with nausea and vomiting. EXAM: CT ABDOMEN AND PELVIS WITH CONTRAST TECHNIQUE: Multidetector CT imaging of the abdomen and pelvis was performed using the standard protocol following bolus administration of intravenous contrast.  CONTRAST:  80 mL Isovue-300 nonionic COMPARISON:  CT abdomen and pelvis May 16, 2009 and March 12, 2012 FINDINGS: Lower chest: There is airspace consolidation in the right lower lobe. There is patchy atelectasis in the left base. Hepatobiliary: There is a cyst in the medial segment of the left lobe of the liver measuring 9 x 8 mm. No other focal liver lesions are evident. Gallbladder is absent. There is intrahepatic biliary duct dilatation. The common bile duct does not appear appreciably dilated. There is an apparent choledochocele in the gallbladder fossa region measuring 3.3 x 3.4 cm, slightly larger than on prior studies. There is no inflammation in this area. There is no air or wall thickening in this area. Pancreas: No pancreatic mass or inflammatory focus. Spleen: No splenic lesions are evident. Adrenals/Urinary Tract: Right adrenal appears normal. There is borderline left adrenal hypertrophy, stable. There is a cyst arising from the posterior lower pole left kidney measuring 3.2 x 2.5 cm. There is no hydronephrosis on either side. There is no evident renal or ureteral calculus on either side. Urinary bladder is midline  with wall thickness within normal limits. Stomach/Bowel: Rectum is distended with fluid and air. No rectal wall thickening evident. There are scattered sigmoid diverticula without diverticulitis. There is no appreciable bowel wall or mesenteric thickening. There is no evident bowel obstruction. No free air or portal venous air. Vascular/Lymphatic: There are foci of atherosclerotic calcification in the aorta. There is no abdominal aortic aneurysm. Major mesenteric vessels appear patent. There is no appreciable adenopathy in the abdomen or pelvis. Reproductive:  Uterus is absent.  There is no pelvic mass. Other: There is scarring in each inguinal region, stable. Appendix is absent. There is no abscess or ascites is apparent in the abdomen or pelvis. No omental lesions are appreciable.  Musculoskeletal: There is degenerative change in the lumbar spine. There are no blastic or lytic bone lesions. There is no intramuscular or abdominal wall lesion evident. IMPRESSION: 1. Airspace consolidation consistent with pneumonia right lower lobe. Atelectasis posterior left base. 2. Gallbladder absent. Choledochocele in the right upper quadrant, slightly larger than on previous study. No wall thickening or air in this area. There is intrahepatic biliary duct dilatation. Common bile duct appears unremarkable. 3. Sigmoid diverticula without diverticulitis evident. Rectum mildly distended with air and fluid. No bowel obstruction. No abscess. Appendix absent. 4.  Uterus absent.  Mild scarring in each inguinal region, stable. 5.  No renal or ureteral calculus.  No hydronephrosis. 6.  Aortic atherosclerosis. Aortic Atherosclerosis (ICD10-I70.0). Electronically Signed   By: Bretta Bang III M.D.   On: 12/19/2016 10:25    Microbiology: Recent Results (from the past 240 hour(s))  Blood culture (routine x 2)     Status: None (Preliminary result)   Collection Time: 12/19/16  6:19 AM  Result Value Ref Range Status   Specimen Description BLOOD RIGHT HAND  Final   Special Requests   Final    BOTTLES DRAWN AEROBIC AND ANAEROBIC Blood Culture adequate volume   Culture NO GROWTH 3 DAYS  Final   Report Status PENDING  Incomplete  Blood culture (routine x 2)     Status: None (Preliminary result)   Collection Time: 12/19/16  6:24 AM  Result Value Ref Range Status   Specimen Description BLOOD LEFT HAND  Final   Special Requests   Final    BOTTLES DRAWN AEROBIC AND ANAEROBIC Blood Culture adequate volume   Culture NO GROWTH 3 DAYS  Final   Report Status PENDING  Incomplete  C difficile quick scan w PCR reflex     Status: None   Collection Time: 12/19/16 11:20 AM  Result Value Ref Range Status   C Diff antigen NEGATIVE NEGATIVE Final   C Diff toxin NEGATIVE NEGATIVE Final   C Diff interpretation No C.  difficile detected.  Final  Urine culture     Status: None   Collection Time: 12/19/16  4:00 PM  Result Value Ref Range Status   Specimen Description URINE, CLEAN CATCH  Final   Special Requests NONE  Final   Culture NO GROWTH  Final   Report Status 12/20/2016 FINAL  Final  Respiratory Panel by PCR     Status: Abnormal   Collection Time: 12/22/16 10:38 AM  Result Value Ref Range Status   Adenovirus DETECTED (A) NOT DETECTED Final   Coronavirus 229E NOT DETECTED NOT DETECTED Final   Coronavirus HKU1 NOT DETECTED NOT DETECTED Final   Coronavirus NL63 NOT DETECTED NOT DETECTED Final   Coronavirus OC43 NOT DETECTED NOT DETECTED Final   Metapneumovirus NOT DETECTED NOT DETECTED Final  Rhinovirus / Enterovirus NOT DETECTED NOT DETECTED Final   Influenza A NOT DETECTED NOT DETECTED Final   Influenza A H1 NOT DETECTED NOT DETECTED Final   Influenza A H1 2009 NOT DETECTED NOT DETECTED Final   Influenza A H3 NOT DETECTED NOT DETECTED Final   Influenza B NOT DETECTED NOT DETECTED Final   Parainfluenza Virus 1 NOT DETECTED NOT DETECTED Final   Parainfluenza Virus 2 NOT DETECTED NOT DETECTED Final   Parainfluenza Virus 3 NOT DETECTED NOT DETECTED Final   Parainfluenza Virus 4 NOT DETECTED NOT DETECTED Final   Respiratory Syncytial Virus NOT DETECTED NOT DETECTED Final   Bordetella pertussis NOT DETECTED NOT DETECTED Final   Chlamydophila pneumoniae NOT DETECTED NOT DETECTED Final   Mycoplasma pneumoniae NOT DETECTED NOT DETECTED Final     Labs: Basic Metabolic Panel:  Recent Labs Lab 12/19/16 0619 12/21/16 0404 12/22/16 0311 12/23/16 0326 12/23/16 0929  NA 123* 132* 133* 135  --   K 3.7 3.4* 3.6 5.4* 3.9  CL 91* 101 98* 98*  --   CO2 18* 24 25 28   --   GLUCOSE 116* 102* 104* 150*  --   BUN 18 <5* <5* <5*  --   CREATININE 1.08* 0.46 0.51 0.43*  --   CALCIUM 8.5* 7.6* 7.8* 7.6*  --   MG  --   --  1.6*  --   --   PHOS  --   --  1.6*  --   --    Liver Function Tests:  Recent  Labs Lab 12/19/16 0619  AST 33  ALT 15  ALKPHOS 42  BILITOT 1.1  PROT 6.0*  ALBUMIN 3.1*   No results for input(s): LIPASE, AMYLASE in the last 168 hours. No results for input(s): AMMONIA in the last 168 hours. CBC:  Recent Labs Lab 12/19/16 0619 12/20/16 0444 12/21/16 0404 12/22/16 0322 12/22/16 0944 12/23/16 0326  WBC 6.3 3.1* 3.0* 3.8* 4.5 3.9*  NEUTROABS 5.5  --   --  2.8 3.8  --   HGB 12.1 10.4* 10.4* 11.5* 11.0* 10.4*  HCT 34.8* 29.5* 31.0* 34.2* 33.4* 31.7*  MCV 86.6 86.3 86.4 88.8 87.9 90.3  PLT 159 121* 140* 177 189 206   Cardiac Enzymes: No results for input(s): CKTOTAL, CKMB, CKMBINDEX, TROPONINI in the last 168 hours. BNP: BNP (last 3 results)  Recent Labs  12/22/16 0322  BNP 162.3*    ProBNP (last 3 results) No results for input(s): PROBNP in the last 8760 hours.  CBG: No results for input(s): GLUCAP in the last 168 hours.     SignedEdsel Petrin  [facility] 12/23/2016, 11:15 AM

## 2016-12-23 NOTE — Discharge Instructions (Signed)
Community-Acquired Pneumonia, Adult Pneumonia is an infection of the lungs. One type of pneumonia can happen while a person is in a hospital. A different type can happen when a person is not in a hospital (community-acquired pneumonia). It is easy for this kind to spread from person to person. It can spread to you if you breathe near an infected person who coughs or sneezes. Some symptoms include:  A dry cough.  A wet (productive) cough.  Fever.  Sweating.  Chest pain.  Follow these instructions at home:  Take over-the-counter and prescription medicines only as told by your doctor. ? Only take cough medicine if you are losing sleep. ? If you were prescribed an antibiotic medicine, take it as told by your doctor. Do not stop taking the antibiotic even if you start to feel better.  Sleep with your head and neck raised (elevated). You can do this by putting a few pillows under your head, or you can sleep in a recliner.  Do not use tobacco products. These include cigarettes, chewing tobacco, and e-cigarettes. If you need help quitting, ask your doctor.  Drink enough water to keep your pee (urine) clear or pale yellow. A shot (vaccine) can help prevent pneumonia. Shots are often suggested for:  People older than 79 years of age.  People older than 79 years of age: ? Who are having cancer treatment. ? Who have long-term (chronic) lung disease. ? Who have problems with their body's defense system (immune system).  You may also prevent pneumonia if you take these actions:  Get the flu (influenza) shot every year.  Go to the dentist as often as told.  Wash your hands often. If soap and water are not available, use hand sanitizer.  Contact a doctor if:  You have a fever.  You lose sleep because your cough medicine does not help. Get help right away if:  You are short of breath and it gets worse.  You have more chest pain.  Your sickness gets worse. This is very serious  if: ? You are an older adult. ? Your body's defense system is weak.  You cough up blood. This information is not intended to replace advice given to you by your health care provider. Make sure you discuss any questions you have with your health care provider. Document Released: 10/15/2007 Document Revised: 10/04/2015 Document Reviewed: 08/23/2014 Elsevier Interactive Patient Education  2018 [employer]  Adenovirus Infection, Adult Adenoviruses are common viruses that cause many different types of infections. The viruses usually affect the lungs, but they can also affect other parts of the body, including the eyes, stomach, bowels, bladder, and brain. The most common type of adenovirus infection is the common cold. Usually, adenovirus infections are not severe unless you have another health problem that makes it hard for your body to fight off infection. What are the causes? You can get this condition if you:  Touch a surface or object that has an adenovirus on it and then touch your mouth, nose, or eyes with unwashed hands.  Come into close physical contact with an infected person, such as by hugging or shaking hands.  Breathe in droplets that fly through the air when an infected person talks, coughs, or sneezes.  Have contact with infected stool.  Swim in a pool that does not have enough chlorine.  Adenoviruses can live outside the body for many weeks. They spread easily from person to person (are contagious). What increases the risk? This condition is more  likely to develop in:  People who spend a lot of time in places where there are many people, such as schools, summer camps, daycare centers, community centers, and [employer] recruit training centers.  Elderly adults.  People with a weak body defense system (immune system).  People with a lung disease.  People with a heart condition.  What are the signs or symptoms? Adenovirus infections usually cause flu-like symptoms.  Once the virus gets into the body, symptoms of this condition can take up to 14 days to develop. Symptoms may include:  Headache.  Stiff neck.  Sleepiness or fatigue.  Confusion or disorientation.  Fever.  Sore throat.  Cough.  Trouble breathing.  Runny nose or congestion.  Pink eye (conjunctivitis).  Bleeding into the covering of the eye.  Stomachache or diarrhea.  Nausea or vomiting.  Blood in the urine or pain while urinating.  Ear pain or fullness.  How is this diagnosed? This condition may be diagnosed based on your symptoms and a physical exam. Your health care provider may order tests to make sure your symptoms are not caused by another type of problem. Tests can include:  Blood tests.  Urine tests.  Stool tests.  Chest X-ray.  Tissue or throat culture.  How is this treated? This condition goes away on its own with time. Treatment for this condition involves managing symptoms until the condition goes away. Your health care provider may recommend:  Rest.  Drinking more fluids.  Taking over-the-counter medicine to help relieve a sore throat, fever, or headache.  Follow these instructions at home:  Rest at home until your symptoms go away.  Drink enough fluid to keep your urine clear or pale yellow.  Take over-the-counter and prescription medicines only as told by your health care provider.  Keep all follow-up visits as told by your health care provider. This is important. How is this prevented? Adenoviruses are resistant to many cleaning products and can remain on surfaces for long periods of time. To help prevent infection:  Wash your hands often with soap and water.  Cover your nose or mouth when you sneeze or cough.  Do not touch your eyes, nose, or mouth with unwashed hands.  Clean commonly used objects often.  Do not swim in a pool that is not properly chlorinated.  Avoid close contact with people who are sick.  Do not go to  school or work when you are sick.  Contact a health care provider if:  Your symptoms do not improve after 10 days.  Your symptoms get worse.  You cannot eat or drink without vomiting. Get help right away if:  You have trouble breathing or you are breathing rapidly.  Your skin, lips, or fingernails look blue (cyanosis).  You have a rapid heart rate.  You become confused.  You lose consciousness. This information is not intended to replace advice given to you by your health care provider. Make sure you discuss any questions you have with your health care provider. Document Released: 07/19/2002 Document Revised: 12/24/2015 Document Reviewed: 12/24/2015 Elsevier Interactive Patient Education  [employer].

## 2016-12-24 LAB — CULTURE, BLOOD (ROUTINE X 2)
CULTURE: NO GROWTH
Culture: NO GROWTH
Special Requests: ADEQUATE
Special Requests: ADEQUATE

## 2016-12-26 DIAGNOSIS — E559 Vitamin D deficiency, unspecified: Secondary | ICD-10-CM | POA: Diagnosis not present

## 2016-12-26 DIAGNOSIS — M545 Low back pain: Secondary | ICD-10-CM | POA: Diagnosis not present

## 2016-12-26 DIAGNOSIS — Z6823 Body mass index (BMI) 23.0-23.9, adult: Secondary | ICD-10-CM | POA: Diagnosis not present

## 2016-12-26 DIAGNOSIS — H547 Unspecified visual loss: Secondary | ICD-10-CM | POA: Diagnosis not present

## 2016-12-26 DIAGNOSIS — E039 Hypothyroidism, unspecified: Secondary | ICD-10-CM | POA: Diagnosis not present

## 2016-12-26 DIAGNOSIS — E785 Hyperlipidemia, unspecified: Secondary | ICD-10-CM | POA: Diagnosis not present

## 2016-12-26 DIAGNOSIS — M47892 Other spondylosis, cervical region: Secondary | ICD-10-CM | POA: Diagnosis not present

## 2016-12-26 DIAGNOSIS — G894 Chronic pain syndrome: Secondary | ICD-10-CM | POA: Diagnosis not present

## 2016-12-26 DIAGNOSIS — I10 Essential (primary) hypertension: Secondary | ICD-10-CM | POA: Diagnosis not present

## 2016-12-29 ENCOUNTER — Other Ambulatory Visit: Payer: Self-pay

## 2016-12-29 DIAGNOSIS — J189 Pneumonia, unspecified organism: Secondary | ICD-10-CM | POA: Diagnosis not present

## 2016-12-29 DIAGNOSIS — R197 Diarrhea, unspecified: Secondary | ICD-10-CM | POA: Diagnosis not present

## 2016-12-29 DIAGNOSIS — F418 Other specified anxiety disorders: Secondary | ICD-10-CM | POA: Diagnosis not present

## 2016-12-29 DIAGNOSIS — R109 Unspecified abdominal pain: Secondary | ICD-10-CM | POA: Diagnosis not present

## 2016-12-29 NOTE — Patient Outreach (Signed)
[facility]) Care Management  12/29/2016  Judy Lindsey 1935-09-29 [id number]     EMMI-PNA RED ON EMMI ALERT Day # 2 Date: 12/27/16 Red Alert Reason: "Had diarrhea or felt sick to stomach? Yes"    Outreach attempt # 1 to patient. No answer. RN CM left HIPAA compliant voicemail message along with contact info.        Plan: RN CM will make outreach attempt to patient within one business day if no return call from patient.    Antionette Fairy, RN,BSN,CCM [facility] Care Management Telephonic Care Management Coordinator Direct Phone: [phone] Toll Free: [phone] Fax: [phone]

## 2016-12-30 ENCOUNTER — Other Ambulatory Visit: Payer: Self-pay

## 2016-12-30 NOTE — Patient Outreach (Signed)
[facility]) Care Management  12/30/2016  Judy Lindsey 1935/11/05 [id number]   EMMI-PNA RED ON EMMI ALERT Day # 2 Date: 12/27/16 Red Alert Reason: "Had diarrhea or felt sick to stomach? Yes"     Outreach attempt #2 to patient. No answer. RN CM left HIPAA compliant voicemail message along with contact info.       Plan: RN CM will send unsuccessful outreach letter to patient and close case if no response within 10 business days.    Antionette Fairy, RN,BSN,CCM [facility] Care Management Telephonic Care Management Coordinator Direct Phone: [phone] Toll Free: [phone] Fax: [phone]

## 2017-01-02 ENCOUNTER — Other Ambulatory Visit: Payer: Self-pay

## 2017-01-02 NOTE — Patient Outreach (Signed)
[facility]) Care Management  01/02/2017  LORECE GARGUS 13-Aug-1935 [id number]     EMMI-PNA RED ON EMMI ALERT Day # 7 Date: 01/01/17 Red Alert Reason: "Had diarrhea or felt sick to stomach? Yes"  "swelling in hands/feet or changes in weight? Yes"   Outreach attempt to patient. No answer. RN CM has already made multiple previous attempts to contact patient regarding previous red alerts. No response from patient. Unsuccessful outreach letter has been sent to patient and awaiting response.        Plan: RN CM will close case if no response from patient.   Antionette Fairy, RN,BSN,CCM [facility] Care Management Telephonic Care Management Coordinator Direct Phone: [phone] Toll Free: [phone] Fax: [phone]

## 2017-01-05 ENCOUNTER — Other Ambulatory Visit: Payer: Self-pay

## 2017-01-05 DIAGNOSIS — J189 Pneumonia, unspecified organism: Secondary | ICD-10-CM | POA: Diagnosis not present

## 2017-01-05 DIAGNOSIS — R197 Diarrhea, unspecified: Secondary | ICD-10-CM | POA: Diagnosis not present

## 2017-01-05 NOTE — Patient Outreach (Signed)
[facility]) Care Management  01/05/2017  SHELDA MATSUNAGA 1936/02/26 [id number]      EMMI- PNA RED ON EMMI ALERT Day # 10 Date: 01/04/17 Red Alert Reason: " More short of breath than yesterday? Yes"   Outreach attempt # 1 to patient. Spoke with patient. She voices that she is not feeling well. RN CM reviewed and addressed red alert. Patient reports that she has not been having any SOB and denies any resp issues. She voices that her main issues is that she remains nauseated all the time and is still very weak. She voices she has completed MD f/u appt and reported her symptoms. She was placed on an anti-emetic that she is taking 2x/day. However, she voices she still continues to suffer from nausea. She denies any vomiting or other GI symptoms. patient repotted she scheduled an appt with MD and goes shortly to be evaluated. She reports that she is still on abx therapy and denies any issues with meds. She voices she has supportive family who is assisting with her care needs. She denies any further RN CM needs or concerns at this time.        Plan: RN CM will notify [facility] administrative assistant of case status.   Antionette Fairy, RN,BSN,CCM [facility] Care Management Telephonic Care Management Coordinator Direct Phone: [phone] Toll Free: [phone] Fax: [phone]

## 2017-01-06 DIAGNOSIS — R197 Diarrhea, unspecified: Secondary | ICD-10-CM | POA: Diagnosis not present

## 2017-01-08 ENCOUNTER — Other Ambulatory Visit: Payer: Self-pay | Admitting: Gastroenterology

## 2017-01-08 DIAGNOSIS — R1310 Dysphagia, unspecified: Secondary | ICD-10-CM

## 2017-01-08 DIAGNOSIS — K210 Gastro-esophageal reflux disease with esophagitis: Secondary | ICD-10-CM | POA: Diagnosis not present

## 2017-01-08 DIAGNOSIS — R110 Nausea: Secondary | ICD-10-CM | POA: Diagnosis not present

## 2017-01-08 DIAGNOSIS — K529 Noninfective gastroenteritis and colitis, unspecified: Secondary | ICD-10-CM | POA: Diagnosis not present

## 2017-01-08 DIAGNOSIS — R1311 Dysphagia, oral phase: Secondary | ICD-10-CM | POA: Diagnosis not present

## 2017-01-13 ENCOUNTER — Ambulatory Visit
Admission: RE | Admit: 2017-01-13 | Discharge: 2017-01-13 | Disposition: A | Discharge status: Home / Self Care | Payer: Medicare PPO | Source: Ambulatory Visit | Attending: Gastroenterology | Admitting: Gastroenterology

## 2017-01-13 DIAGNOSIS — R1310 Dysphagia, unspecified: Secondary | ICD-10-CM | POA: Diagnosis not present

## 2017-02-17 DIAGNOSIS — Z23 Encounter for immunization: Secondary | ICD-10-CM | POA: Diagnosis not present

## 2017-03-02 DIAGNOSIS — E559 Vitamin D deficiency, unspecified: Secondary | ICD-10-CM | POA: Diagnosis not present

## 2017-03-31 DIAGNOSIS — L570 Actinic keratosis: Secondary | ICD-10-CM | POA: Diagnosis not present

## 2017-03-31 DIAGNOSIS — I10 Essential (primary) hypertension: Secondary | ICD-10-CM | POA: Diagnosis not present

## 2017-03-31 DIAGNOSIS — M1990 Unspecified osteoarthritis, unspecified site: Secondary | ICD-10-CM | POA: Diagnosis not present

## 2017-03-31 DIAGNOSIS — F411 Generalized anxiety disorder: Secondary | ICD-10-CM | POA: Diagnosis not present

## 2017-03-31 DIAGNOSIS — F418 Other specified anxiety disorders: Secondary | ICD-10-CM | POA: Diagnosis not present

## 2017-03-31 DIAGNOSIS — E039 Hypothyroidism, unspecified: Secondary | ICD-10-CM | POA: Diagnosis not present

## 2017-03-31 DIAGNOSIS — G479 Sleep disorder, unspecified: Secondary | ICD-10-CM | POA: Diagnosis not present

## 2017-04-08 DIAGNOSIS — R300 Dysuria: Secondary | ICD-10-CM | POA: Diagnosis not present

## 2017-04-08 DIAGNOSIS — D649 Anemia, unspecified: Secondary | ICD-10-CM | POA: Diagnosis not present

## 2017-04-08 DIAGNOSIS — N390 Urinary tract infection, site not specified: Secondary | ICD-10-CM | POA: Diagnosis not present

## 2017-05-25 DIAGNOSIS — E7800 Pure hypercholesterolemia, unspecified: Secondary | ICD-10-CM | POA: Diagnosis not present

## 2017-05-25 DIAGNOSIS — I10 Essential (primary) hypertension: Secondary | ICD-10-CM | POA: Diagnosis not present

## 2017-05-25 DIAGNOSIS — E039 Hypothyroidism, unspecified: Secondary | ICD-10-CM | POA: Diagnosis not present

## 2017-05-25 DIAGNOSIS — F418 Other specified anxiety disorders: Secondary | ICD-10-CM | POA: Diagnosis not present

## 2017-06-16 DIAGNOSIS — H524 Presbyopia: Secondary | ICD-10-CM | POA: Diagnosis not present

## 2017-06-16 DIAGNOSIS — Z961 Presence of intraocular lens: Secondary | ICD-10-CM | POA: Diagnosis not present

## 2017-08-21 DIAGNOSIS — R300 Dysuria: Secondary | ICD-10-CM | POA: Diagnosis not present

## 2017-09-28 ENCOUNTER — Other Ambulatory Visit: Payer: Self-pay | Admitting: Family Medicine

## 2017-09-28 DIAGNOSIS — M545 Low back pain: Principal | ICD-10-CM

## 2017-09-28 DIAGNOSIS — M549 Dorsalgia, unspecified: Secondary | ICD-10-CM | POA: Diagnosis not present

## 2017-09-28 DIAGNOSIS — G8929 Other chronic pain: Secondary | ICD-10-CM

## 2017-10-07 ENCOUNTER — Ambulatory Visit
Admission: RE | Admit: 2017-10-07 | Discharge: 2017-10-07 | Disposition: A | Discharge status: Home / Self Care | Payer: Medicare PPO | Source: Ambulatory Visit | Attending: Family Medicine | Admitting: Family Medicine

## 2017-10-07 DIAGNOSIS — G8929 Other chronic pain: Secondary | ICD-10-CM

## 2017-10-07 DIAGNOSIS — K7689 Other specified diseases of liver: Secondary | ICD-10-CM | POA: Diagnosis not present

## 2017-10-07 DIAGNOSIS — M545 Low back pain: Principal | ICD-10-CM

## 2017-10-07 MED ORDER — IOPAMIDOL (ISOVUE-300) INJECTION 61%
100.0000 mL | Freq: Once | INTRAVENOUS | Status: AC | PRN
  Administered 2017-10-07: 100 mL via INTRAVENOUS

## 2017-11-03 ENCOUNTER — Other Ambulatory Visit: Payer: Self-pay | Admitting: Gastroenterology

## 2017-11-03 DIAGNOSIS — R1011 Right upper quadrant pain: Secondary | ICD-10-CM

## 2017-11-03 DIAGNOSIS — R932 Abnormal findings on diagnostic imaging of liver and biliary tract: Secondary | ICD-10-CM

## 2017-11-06 ENCOUNTER — Ambulatory Visit
Admission: RE | Admit: 2017-11-06 | Discharge: 2017-11-06 | Disposition: A | Discharge status: Home / Self Care | Payer: 59 | Source: Ambulatory Visit | Attending: Gastroenterology | Admitting: Gastroenterology

## 2017-11-06 DIAGNOSIS — R1011 Right upper quadrant pain: Secondary | ICD-10-CM

## 2017-11-06 DIAGNOSIS — K7689 Other specified diseases of liver: Secondary | ICD-10-CM | POA: Diagnosis not present

## 2017-11-06 DIAGNOSIS — N2889 Other specified disorders of kidney and ureter: Secondary | ICD-10-CM | POA: Diagnosis not present

## 2017-11-06 DIAGNOSIS — R932 Abnormal findings on diagnostic imaging of liver and biliary tract: Secondary | ICD-10-CM

## 2017-11-06 MED ORDER — GADOBENATE DIMEGLUMINE 529 MG/ML IV SOLN: 15.0000 mL | Freq: Once | INTRAVENOUS | Status: DC | PRN

## 2017-11-06 MED ORDER — GADOBENATE DIMEGLUMINE 529 MG/ML IV SOLN
14.0000 mL | Freq: Once | INTRAVENOUS | Status: AC | PRN
  Administered 2017-11-06: 14 mL via INTRAVENOUS

## 2017-11-16 DIAGNOSIS — Z1231 Encounter for screening mammogram for malignant neoplasm of breast: Secondary | ICD-10-CM | POA: Diagnosis not present

## 2018-02-04 DIAGNOSIS — Z0000 Encounter for general adult medical examination without abnormal findings: Secondary | ICD-10-CM | POA: Diagnosis not present

## 2018-02-04 DIAGNOSIS — L209 Atopic dermatitis, unspecified: Secondary | ICD-10-CM | POA: Diagnosis not present

## 2018-02-04 DIAGNOSIS — J301 Allergic rhinitis due to pollen: Secondary | ICD-10-CM | POA: Diagnosis not present

## 2018-02-04 DIAGNOSIS — E7800 Pure hypercholesterolemia, unspecified: Secondary | ICD-10-CM | POA: Diagnosis not present

## 2018-02-04 DIAGNOSIS — I10 Essential (primary) hypertension: Secondary | ICD-10-CM | POA: Diagnosis not present

## 2018-02-04 DIAGNOSIS — Z1389 Encounter for screening for other disorder: Secondary | ICD-10-CM | POA: Diagnosis not present

## 2018-02-04 DIAGNOSIS — M1990 Unspecified osteoarthritis, unspecified site: Secondary | ICD-10-CM | POA: Diagnosis not present

## 2018-02-04 DIAGNOSIS — E041 Nontoxic single thyroid nodule: Secondary | ICD-10-CM | POA: Diagnosis not present

## 2018-02-04 DIAGNOSIS — N3010 Interstitial cystitis (chronic) without hematuria: Secondary | ICD-10-CM | POA: Diagnosis not present

## 2018-02-04 DIAGNOSIS — Z23 Encounter for immunization: Secondary | ICD-10-CM | POA: Diagnosis not present

## 2018-02-04 DIAGNOSIS — E039 Hypothyroidism, unspecified: Secondary | ICD-10-CM | POA: Diagnosis not present

## 2018-04-01 DIAGNOSIS — K5900 Constipation, unspecified: Secondary | ICD-10-CM | POA: Diagnosis not present

## 2018-04-01 DIAGNOSIS — E039 Hypothyroidism, unspecified: Secondary | ICD-10-CM | POA: Diagnosis not present

## 2018-04-01 DIAGNOSIS — I499 Cardiac arrhythmia, unspecified: Secondary | ICD-10-CM | POA: Diagnosis not present

## 2018-04-01 DIAGNOSIS — E7800 Pure hypercholesterolemia, unspecified: Secondary | ICD-10-CM | POA: Diagnosis not present

## 2018-04-05 ENCOUNTER — Encounter: Payer: Self-pay | Admitting: Physician Assistant

## 2018-04-05 ENCOUNTER — Telehealth: Payer: Self-pay

## 2018-04-05 ENCOUNTER — Ambulatory Visit (INDEPENDENT_AMBULATORY_CARE_PROVIDER_SITE_OTHER): Payer: Medicare PPO | Admitting: Physician Assistant

## 2018-04-05 VITALS — BP 122/80 | HR 53 | Ht 63.0 in | Wt 145.2 lb

## 2018-04-05 DIAGNOSIS — R011 Cardiac murmur, unspecified: Secondary | ICD-10-CM | POA: Diagnosis not present

## 2018-04-05 DIAGNOSIS — R002 Palpitations: Secondary | ICD-10-CM

## 2018-04-05 DIAGNOSIS — R001 Bradycardia, unspecified: Secondary | ICD-10-CM

## 2018-04-05 NOTE — Telephone Encounter (Signed)
Spoke with patient and she was agreeable with appt today with Cassie Freerhonda Barett, PA-C at 1pm.

## 2018-04-05 NOTE — Patient Instructions (Signed)
Medication Instructions:  Your physician recommends that you continue on your current medications as directed. Please refer to the Current Medication list given to you today. If you need a refill on your cardiac medications before your next appointment, please call your pharmacy.   Lab work: None  If you have labs (blood work) drawn today and your tests are completely normal, you will receive your results only by: Marland Kitchen. MyChart Message (if you have MyChart) OR . A paper copy in the mail If you have any lab test that is abnormal or we need to change your treatment, we will call you to review the results.  Testing/Procedures: Your physician has requested that you have an echocardiogram. Echocardiography is a painless test that uses sound waves to create images of your heart. It provides your doctor with information about the size and shape of your heart and how well your heart's chambers and valves are working. This procedure takes approximately one hour. There are no restrictions for this procedure.  Your physician has recommended that you wear an 30 DAY event monitor. Event monitors are medical devices that record the heart's electrical activity. Doctors most often us these monitors to diagnose arrhythmias. Arrhythmias are problems with the speed or rhythm of the heartbeat. The monitor is a small, portable device. You can wear one while you do your normal daily activities. This is usually used to diagnose what is causing palpitations/syncope (passing out). [address]  Follow-Up: At [facility], you and your health needs are our priority.  As part of our continuing mission to provide you with exceptional heart care, we have created designated Provider Care Teams.  These Care Teams include your primary Cardiologist (physician) and Advanced Practice Providers (APPs -  Physician Assistants and Nurse Practitioners) who all work together to provide you with the care you need, when you  need it. Your physician recommends that you schedule a follow-up appointment in: 6 WEEKS with DR Anne FuSKAINS  Any Other Special Instructions Will Be Listed Below (If Applicable).

## 2018-04-05 NOTE — Telephone Encounter (Signed)
-----   Message from Darrol Jumphonda G Barrett, PA-C sent at 04/04/2018  6:46 PM EST ----- Sending this to both of you because I don't know who is working tomorrow.  Ms Judy Lindsey is a friend who is having palpitations, irregular HR.  She is symptomatic. She HAS to be seen tomorrow. New to cards. If no other option, put her on my schedule for 1 pm. Just let me know.  Thanks, Bjorn Loserhonda

## 2018-04-05 NOTE — Progress Notes (Signed)
Cardiology Office Note   Date:  04/05/2018   ID:  Judy Lindsey, DOB 12/19/1935, MRN [id number]  PCP:  Maurice SmallGriffin, Elaine, MD Cardiologist:  New, Dr Anne FuSkains (who is her husband, Burns Spainharles Hernan's doctor) Theodore Demarkhonda Barrett, PA-C   No chief complaint on file.  History of Present Illness: Judy CarrelDoris A Lindsey is a 82 y.o. female with a history of HTN, Hypothyroid, SEM since childhood, PNA 12/2016.  Phone notes regarding palpitations, symptomatic, appt made.  Judy Lindsey presents for cardiology evaluation.   Judy Lindsey does not normally have any cardiac sx. She has sinus bradycardia at baseline, with a heart rate in the 50s.   She does not exercise, but is active around the house and yard. Is able to climb steps w/out stopping. Does not feel limited by DOE in her daily life.  Mild daytime lower extremity edema.  No orthopnea or PND.    Never gets chest pain.   About a month ago, she started noticing palpitations. HR spikes were seen on her phone, some > 150 bpm. Many times, the palpitations were very brief, but they have lasted longer. When they last longer, she feels weak and tired. One day, it lasted 24 hr and she was barely able to get out of bed that day.   No syncope, but was very light-headed when palps lasted all day. She gets something every day, but they may last only seconds.   They have started to worry her, especially when they lasted long enough that she felt bad.  She has not had any unilateral weakness or numbness.  No stroke symptoms.    Her thyroid was recently checked by her PCP, among other labs, and was within normal limits.  No recent dose change on her Synthroid.   Past Medical History:  Diagnosis Date  . Arthritis   . Fall 2004   mechanical fall, down stair, broke her leg  . Heart murmur   . Hemorrhoids   . History of urinary tract infection   . Hypertension   . Hypothyroidism   . Urinary incontinence     Past Surgical History:  Procedure Laterality Date  .  ABDOMINAL HYSTERECTOMY    . ANKLE SURGERY     right had screw placed still has   . APPENDECTOMY    . arm surgery      had excess skin removed from under upper arm area bilat 05/16/2015   . CHOLECYSTECTOMY    . EYE SURGERY     cataract surgery bilat   . HEMORRHOID SURGERY    . HERNIA REPAIR    . TONSILLECTOMY    . TOTAL KNEE ARTHROPLASTY Left 07/10/2015   Procedure: LEFT TOTAL KNEE ARTHROPLASTY;  Surgeon: Durene RomansMatthew Olin, MD;  Location: [facility];  Service: Orthopedics;  Laterality: Left;  . tummy tuck      20 years ago     Current Outpatient Medications  Medication Sig Dispense Refill  . benazepril (LOTENSIN) 10 MG tablet Take 10 mg by mouth daily.   0  . benzonatate (TESSALON) 200 MG capsule Take 1 capsule (200 mg total) by mouth 3 (three) times daily. 20 capsule 0  . betamethasone dipropionate (DIPROLENE) 0.05 % cream Apply 1 application topically daily as needed (eczema).   0  . polyethylene glycol (MIRALAX / GLYCOLAX) packet Take 17 g by mouth 2 (two) times daily. (Patient taking differently: Take 17 g by mouth daily as needed for mild constipation. ) 14 each 0  . promethazine (  PHENERGAN) 25 MG tablet Take 25 mg by mouth 3 (three) times daily as needed for nausea.  1  . rosuvastatin (CRESTOR) 10 MG tablet Take 10 mg by mouth 2 (two) times a week.   0  . SYNTHROID 100 MCG tablet Take 100 mcg by mouth daily before breakfast.   0  . celecoxib (CELEBREX) 200 MG capsule Take 200mg  by mouth once daily if needed for pain  0  . traMADol (ULTRAM) 50 MG tablet Take 50 mg by mouth every 6 (six) hours as needed for moderate pain.     No current facility-administered medications for this visit.     Allergies:   Codeine and Nubain [nalbuphine]    Social History:  The patient  reports that she has never smoked. She has never used smokeless tobacco. She reports that she drinks alcohol. She reports that she does not use drugs.   Family History:  The patient's family history includes CAD in her  father; Parkinson's disease in her mother.  She indicated that her mother is deceased. She indicated that her father is deceased.   ROS:  Please see the history of present illness. All other systems are reviewed and negative.    PHYSICAL EXAM: VS:  BP 122/80   Pulse (!) 53   Ht 5\' 3"  (1.6 m)   Wt 145 lb 3.2 oz (65.9 kg)   BMI 25.72 kg/m  , BMI Body mass index is 25.72 kg/m. GEN: Well nourished, well developed, female in no acute distress HEENT: normal for age  Neck: no JVD, no carotid bruit, no masses Cardiac: RRR; 2/6 murmur, no rubs, or gallops Respiratory:  clear to auscultation bilaterally, normal work of breathing GI: soft, nontender, nondistended, + BS Judy: no deformity or atrophy; trace lower extremity edema; distal pulses are 2+ in all 4 extremities  Skin: warm and dry, no rash Neuro:  Strength and sensation are intact Psych: euthymic mood, full affect   EKG:  EKG is ordered today. The ekg ordered today demonstrates sinus bradycardia, heart rate 53, Q waves in leads III and aVF, no change from 12/2016  ECHO: Ordered  MONITOR: Ordered   Recent Labs: See [facility] print out No results found for requested labs within last 8760 hours.  CBC    Component Value Date/Time   WBC 3.9 (L) 12/23/2016 0326   RBC 3.51 (L) 12/23/2016 0326   HGB 10.4 (L) 12/23/2016 0326   HCT 31.7 (L) 12/23/2016 0326   PLT 206 12/23/2016 0326   MCV 90.3 12/23/2016 0326   MCH 29.6 12/23/2016 0326   MCHC 32.8 12/23/2016 0326   RDW 14.1 12/23/2016 0326   LYMPHSABS 0.6 (L) 12/22/2016 0944   MONOABS 0.1 12/22/2016 0944   EOSABS 0.0 12/22/2016 0944   BASOSABS 0.0 12/22/2016 0944   CMP Latest Ref Rng & Units 12/23/2016 12/23/2016 12/22/2016  Glucose 65 - 99 mg/dL - 161(W) 960(A)  BUN 6 - 20 mg/dL - <5(W) <0(J)  Creatinine 0.44 - 1.00 mg/dL - 8.11(B) 1.47  Sodium 135 - 145 mmol/L - 135 133(L)  Potassium 3.5 - 5.1 mmol/L 3.9 5.4(H) 3.6  Chloride 101 - 111 mmol/L - 98(L) 98(L)  CO2 22 - 32 mmol/L - 28  25  Calcium 8.9 - 10.3 mg/dL - 7.6(L) 7.8(L)  Total Protein 6.5 - 8.1 g/dL - - -  Total Bilirubin 0.3 - 1.2 mg/dL - - -  Alkaline Phos 38 - 126 U/L - - -  AST 15 - 41 U/L - - -  ALT 14 - 54 U/L - - -   Lab Results  Component Value Date   TSH 0.415 12/19/2016    Lipid Panel No results found for: CHOL, HDL, LDLCALC, LDLDIRECT, TRIG, CHOLHDL    Wt Readings from Last 3 Encounters:  04/05/18 145 lb 3.2 oz (65.9 kg)  12/19/16 139 lb (63 kg)  07/10/15 148 lb (67.1 kg)     Other studies Reviewed: Additional studies/ records that were reviewed today include: hospital records, faxed records from [place].  ASSESSMENT AND PLAN:  1.  Palpitations: -No caffeine indiscretions, no over-the-counter medications -No recent change in thyroid medication - She is at risk to have atrial fibrillation or other arrhythmias. - Check an event monitor plus an echocardiogram, follow-up on the results.  2.  Sinus bradycardia: - She is not on any rate lowering medications. -No symptoms attributable to this. - Continue to follow, cannot add beta-blocker or rate lowering calcium channel blocker  3.  Systolic murmur: -She has had this since childhood -No symptoms of heart failure, no presyncope or syncope except in the setting of the palpitations.   Current medicines are reviewed at length with the patient today.  The patient does not have concerns regarding medicines.  The following changes have been made:  no change  Labs/ tests ordered today include:   Orders Placed This Encounter  Procedures  . Cardiac event monitor  . EKG 12-Lead  . ECHOCARDIOGRAM COMPLETE     Disposition:   FU with Donato Schultz, MD  Signed, Theodore Demark, PA-C  04/05/2018 3:00 PM    [facility] Phone: [phone]; Fax: [phone]

## 2018-04-07 ENCOUNTER — Ambulatory Visit (HOSPITAL_COMMUNITY): Payer: Medicare PPO | Attending: Internal Medicine

## 2018-04-07 DIAGNOSIS — R002 Palpitations: Secondary | ICD-10-CM

## 2018-04-07 DIAGNOSIS — I340 Nonrheumatic mitral (valve) insufficiency: Secondary | ICD-10-CM | POA: Diagnosis not present

## 2018-04-13 ENCOUNTER — Ambulatory Visit (INDEPENDENT_AMBULATORY_CARE_PROVIDER_SITE_OTHER): Payer: Medicare PPO

## 2018-04-13 DIAGNOSIS — R002 Palpitations: Secondary | ICD-10-CM | POA: Diagnosis not present

## 2018-04-14 DIAGNOSIS — R002 Palpitations: Secondary | ICD-10-CM | POA: Diagnosis not present

## 2018-05-19 ENCOUNTER — Encounter (INDEPENDENT_AMBULATORY_CARE_PROVIDER_SITE_OTHER): Payer: Self-pay

## 2018-05-19 ENCOUNTER — Encounter: Payer: Self-pay | Admitting: Cardiology

## 2018-05-19 ENCOUNTER — Ambulatory Visit (INDEPENDENT_AMBULATORY_CARE_PROVIDER_SITE_OTHER): Payer: Medicare PPO | Admitting: Cardiology

## 2018-05-19 VITALS — BP 150/70 | HR 60 | Ht 63.0 in | Wt 147.1 lb

## 2018-05-19 DIAGNOSIS — R002 Palpitations: Secondary | ICD-10-CM

## 2018-05-19 DIAGNOSIS — R001 Bradycardia, unspecified: Secondary | ICD-10-CM

## 2018-05-19 DIAGNOSIS — R011 Cardiac murmur, unspecified: Secondary | ICD-10-CM | POA: Diagnosis not present

## 2018-05-19 NOTE — Progress Notes (Signed)
Cardiology Office Note:    Date:  05/19/2018   ID:  Dyanne Carrel, DOB 02-22-1936, MRN [id number]  PCP:  Maurice Small, MD  Cardiologist:  Donato Schultz, MD  Electrophysiologist:  None   Referring MD: Maurice Small, MD    History of Present Illness:    Judy Lindsey is a 83 y.o. female here for follow-up of event monitor, echocardiogram bradycardia, systolic murmur.  No significant dyspnea on exertion currently.  Echocardiogram 04/07/2018:  - Normal biventricular chamber size and function. Normal biatrial   chamber size. Mild-moderate pulmonary valve regurgitation. Mild   mitral valve regurgitation. RVSP 27 mmHg. RA pressure estimate 3   mmHg. overall reassuring  Event monitor 04/2018 - No evidence of atrial fibrillation.  Periodic sinus tachycardia into the 130 range.  Bradycardia 49 at 4 AM during sleeping transiently.  No pauses.  Overall reassuring.  Discussed at length.    Past Medical History:  Diagnosis Date  . Arthritis   . Fall 2004   mechanical fall, down stair, broke her leg  . Heart murmur   . Hemorrhoids   . History of urinary tract infection   . Hypertension   . Hypothyroidism   . Urinary incontinence     Past Surgical History:  Procedure Laterality Date  . ABDOMINAL HYSTERECTOMY    . ANKLE SURGERY     right had screw placed still has   . APPENDECTOMY    . arm surgery      had excess skin removed from under upper arm area bilat 05/16/2015   . CHOLECYSTECTOMY    . EYE SURGERY     cataract surgery bilat   . HEMORRHOID SURGERY    . HERNIA REPAIR    . TONSILLECTOMY    . TOTAL KNEE ARTHROPLASTY Left 07/10/2015   Procedure: LEFT TOTAL KNEE ARTHROPLASTY;  Surgeon: Durene Romans, MD;  Location: [facility];  Service: Orthopedics;  Laterality: Left;  . tummy tuck      20 years ago     Current Medications: Current Meds  Medication Sig  . benazepril (LOTENSIN) 10 MG tablet Take 10 mg by mouth daily.   . betamethasone dipropionate (DIPROLENE) 0.05 % cream  Apply 1 application topically daily as needed (eczema).   . celecoxib (CELEBREX) 200 MG capsule Take 200mg  by mouth once daily if needed for pain  . polyethylene glycol (MIRALAX / GLYCOLAX) packet Take 17 g by mouth 2 (two) times daily.  . rosuvastatin (CRESTOR) 10 MG tablet Take 10 mg by mouth 2 (two) times a week.   Marland Kitchen SYNTHROID 100 MCG tablet Take 75 mcg by mouth daily before breakfast.   . traMADol (ULTRAM) 50 MG tablet Take 50 mg by mouth every 6 (six) hours as needed for moderate pain.     Allergies:   Codeine and Nubain [nalbuphine]   Social History   Socioeconomic History  . Marital status: Married    Spouse name: Not on file  . Number of children: Not on file  . Years of education: Not on file  . Highest education level: Not on file  Occupational History  . Not on file  Social Needs  . Financial resource strain: Not on file  . Food insecurity:    Worry: Not on file    Inability: Not on file  . Transportation needs:    Medical: Not on file    Non-medical: Not on file  Tobacco Use  . Smoking status: Never Smoker  . Smokeless tobacco: Never Used  Substance and Sexual Activity  . Alcohol use: Yes    Comment: occas glass of wine   . Drug use: No  . Sexual activity: Not on file  Lifestyle  . Physical activity:    Days per week: Not on file    Minutes per session: Not on file  . Stress: Not on file  Relationships  . Social connections:    Talks on phone: Not on file    Gets together: Not on file    Attends religious service: Not on file    Active member of club or organization: Not on file    Attends meetings of clubs or organizations: Not on file    Relationship status: Not on file  Other Topics Concern  . Not on file  Social History Narrative  . Not on file     Family History: The patient's family history includes CAD in her father; Parkinson's disease in her mother.  ROS:   Please see the history of present illness.    Denies any fevers chills nausea  vomiting syncope all other systems reviewed and are negative.  EKGs/Labs/Other Studies Reviewed:    The following studies were reviewed today:  Echo event monitor lab work office note  EKG:  EKG is not ordered today.  Recent Labs: No results found for requested labs within last 8760 hours.  Recent Lipid Panel No results found for: CHOL, TRIG, HDL, CHOLHDL, VLDL, LDLCALC, LDLDIRECT  Physical Exam:    VS:  BP (!) 150/70   Pulse 60   Ht 5\' 3"  (1.6 m)   Wt 147 lb 1.9 oz (66.7 kg)   SpO2 95%   BMI 26.06 kg/m     Wt Readings from Last 3 Encounters:  05/19/18 147 lb 1.9 oz (66.7 kg)  04/05/18 145 lb 3.2 oz (65.9 kg)  12/19/16 139 lb (63 kg)     GEN:  Well nourished, well developed in no acute distress HEENT: Normal NECK: No JVD; No carotid bruits LYMPHATICS: No lymphadenopathy CARDIAC: RRR, no murmurs, rubs, gallops RESPIRATORY:  Clear to auscultation without rales, wheezing or rhonchi  ABDOMEN: Soft, non-tender, non-distended MUSCULOSKELETAL:  No edema; No deformity  SKIN: Warm and dry NEUROLOGIC:  Alert and oriented x 3 PSYCHIATRIC:  Normal affect   ASSESSMENT:    1. Palpitations   2. Sinus bradycardia   3. Systolic murmur    PLAN:    In order of problems listed above:  Palpitations -Event monitor personally reviewed with her and her husband.  Appears to be sinus tachycardia, no evidence of atrial fibrillation.  Overall reassuring.  Heart rate at times on her apple watch which show upper 130s or at one time 140 transiently.  Thankfully, this was not atrial fibrillation.  We explained the implications of atrial fibrillation, anticoagulation, stroke.  For now, I will continue with conservative management.  Try to avoid excessive caffeine.  Continue with exercise.  Systolic murmur - Mild mitral regurgitation.  Should be of no clinical consequence.  Elevated blood pressure -Continue to monitor this, perhaps take blood pressure once a week.  If necessary can increase  the benazepril to 20 mg.  She is not on a diuretic.  She states that she urinates quite frequently.  We will see her back in 1 month.   Medication Adjustments/Labs and Tests Ordered: Current medicines are reviewed at length with the patient today.  Concerns regarding medicines are outlined above.  No orders of the defined types were placed in this encounter.  No orders of the defined types were placed in this encounter.   Patient Instructions  Medication Instructions:  Your physician recommends that you continue on your current medications as directed. Please refer to the Current Medication list given to you today.  Labwork: None ordered.  Testing/Procedures: None ordered.  Follow-Up: Your physician recommends that you schedule a follow-up appointment in:   One Year with Dr Anne FuSkains  Any Other Special Instructions Will Be Listed Below (If Applicable).     If you need a refill on your cardiac medications before your next appointment, please call your pharmacy.     Signed, Donato SchultzMark Rosaly Labarbera, MD  05/19/2018 4:39 PM    [facility]

## 2018-05-19 NOTE — Patient Instructions (Signed)
Medication Instructions:  Your physician recommends that you continue on your current medications as directed. Please refer to the Current Medication list given to you today.  Labwork: None ordered.  Testing/Procedures: None ordered.  Follow-Up: Your physician recommends that you schedule a follow-up appointment in:   One Year with Dr Skains  Any Other Special Instructions Will Be Listed Below (If Applicable).     If you need a refill on your cardiac medications before your next appointment, please call your pharmacy.  

## 2018-06-15 DIAGNOSIS — M546 Pain in thoracic spine: Secondary | ICD-10-CM | POA: Diagnosis not present

## 2018-06-15 DIAGNOSIS — Z6826 Body mass index (BMI) 26.0-26.9, adult: Secondary | ICD-10-CM | POA: Diagnosis not present

## 2018-06-15 DIAGNOSIS — R030 Elevated blood-pressure reading, without diagnosis of hypertension: Secondary | ICD-10-CM | POA: Diagnosis not present

## 2018-06-15 DIAGNOSIS — M5416 Radiculopathy, lumbar region: Secondary | ICD-10-CM | POA: Diagnosis not present

## 2018-06-15 DIAGNOSIS — M47816 Spondylosis without myelopathy or radiculopathy, lumbar region: Secondary | ICD-10-CM | POA: Diagnosis not present

## 2018-06-15 DIAGNOSIS — M549 Dorsalgia, unspecified: Secondary | ICD-10-CM | POA: Diagnosis not present

## 2018-06-15 DIAGNOSIS — M5136 Other intervertebral disc degeneration, lumbar region: Secondary | ICD-10-CM | POA: Diagnosis not present

## 2018-06-15 DIAGNOSIS — M4316 Spondylolisthesis, lumbar region: Secondary | ICD-10-CM | POA: Diagnosis not present

## 2018-06-16 DIAGNOSIS — M5416 Radiculopathy, lumbar region: Secondary | ICD-10-CM | POA: Diagnosis not present

## 2018-06-16 DIAGNOSIS — M545 Low back pain: Secondary | ICD-10-CM | POA: Diagnosis not present

## 2018-06-22 DIAGNOSIS — Z961 Presence of intraocular lens: Secondary | ICD-10-CM | POA: Diagnosis not present

## 2018-07-19 DIAGNOSIS — G5603 Carpal tunnel syndrome, bilateral upper limbs: Secondary | ICD-10-CM | POA: Diagnosis not present

## 2018-07-19 DIAGNOSIS — M4316 Spondylolisthesis, lumbar region: Secondary | ICD-10-CM | POA: Diagnosis not present

## 2018-07-19 DIAGNOSIS — M5416 Radiculopathy, lumbar region: Secondary | ICD-10-CM | POA: Diagnosis not present

## 2018-07-19 DIAGNOSIS — M4155 Other secondary scoliosis, thoracolumbar region: Secondary | ICD-10-CM | POA: Diagnosis not present

## 2018-07-19 DIAGNOSIS — M5136 Other intervertebral disc degeneration, lumbar region: Secondary | ICD-10-CM | POA: Diagnosis not present

## 2018-07-19 DIAGNOSIS — Z6826 Body mass index (BMI) 26.0-26.9, adult: Secondary | ICD-10-CM | POA: Diagnosis not present

## 2018-09-29 DIAGNOSIS — M4155 Other secondary scoliosis, thoracolumbar region: Secondary | ICD-10-CM | POA: Diagnosis not present

## 2018-09-29 DIAGNOSIS — M4316 Spondylolisthesis, lumbar region: Secondary | ICD-10-CM | POA: Diagnosis not present

## 2018-10-07 DIAGNOSIS — E7800 Pure hypercholesterolemia, unspecified: Secondary | ICD-10-CM | POA: Diagnosis not present

## 2018-10-07 DIAGNOSIS — I10 Essential (primary) hypertension: Secondary | ICD-10-CM | POA: Diagnosis not present

## 2018-10-07 DIAGNOSIS — E039 Hypothyroidism, unspecified: Secondary | ICD-10-CM | POA: Diagnosis not present

## 2018-10-07 DIAGNOSIS — R202 Paresthesia of skin: Secondary | ICD-10-CM | POA: Diagnosis not present

## 2018-11-11 DIAGNOSIS — M4316 Spondylolisthesis, lumbar region: Secondary | ICD-10-CM | POA: Diagnosis not present

## 2018-11-22 DIAGNOSIS — Z90710 Acquired absence of both cervix and uterus: Secondary | ICD-10-CM | POA: Diagnosis not present

## 2018-11-22 DIAGNOSIS — Z96652 Presence of left artificial knee joint: Secondary | ICD-10-CM | POA: Diagnosis not present

## 2018-11-22 DIAGNOSIS — M810 Age-related osteoporosis without current pathological fracture: Secondary | ICD-10-CM | POA: Diagnosis not present

## 2018-11-22 DIAGNOSIS — Z8262 Family history of osteoporosis: Secondary | ICD-10-CM | POA: Diagnosis not present

## 2018-11-22 DIAGNOSIS — Z1231 Encounter for screening mammogram for malignant neoplasm of breast: Secondary | ICD-10-CM | POA: Diagnosis not present

## 2018-11-23 DIAGNOSIS — M4802 Spinal stenosis, cervical region: Secondary | ICD-10-CM | POA: Diagnosis not present

## 2018-11-23 DIAGNOSIS — Z6825 Body mass index (BMI) 25.0-25.9, adult: Secondary | ICD-10-CM | POA: Diagnosis not present

## 2018-11-23 DIAGNOSIS — I10 Essential (primary) hypertension: Secondary | ICD-10-CM | POA: Diagnosis not present

## 2018-12-20 DIAGNOSIS — M47816 Spondylosis without myelopathy or radiculopathy, lumbar region: Secondary | ICD-10-CM | POA: Diagnosis not present

## 2018-12-20 DIAGNOSIS — M4316 Spondylolisthesis, lumbar region: Secondary | ICD-10-CM | POA: Diagnosis not present

## 2019-01-20 DIAGNOSIS — M810 Age-related osteoporosis without current pathological fracture: Secondary | ICD-10-CM | POA: Diagnosis not present

## 2019-01-20 DIAGNOSIS — E039 Hypothyroidism, unspecified: Secondary | ICD-10-CM | POA: Diagnosis not present

## 2019-01-20 DIAGNOSIS — E7800 Pure hypercholesterolemia, unspecified: Secondary | ICD-10-CM | POA: Diagnosis not present

## 2019-01-20 DIAGNOSIS — I10 Essential (primary) hypertension: Secondary | ICD-10-CM | POA: Diagnosis not present

## 2019-01-20 DIAGNOSIS — Z0000 Encounter for general adult medical examination without abnormal findings: Secondary | ICD-10-CM | POA: Diagnosis not present

## 2019-01-20 DIAGNOSIS — N3010 Interstitial cystitis (chronic) without hematuria: Secondary | ICD-10-CM | POA: Diagnosis not present

## 2019-01-20 DIAGNOSIS — M4802 Spinal stenosis, cervical region: Secondary | ICD-10-CM | POA: Diagnosis not present

## 2019-01-20 DIAGNOSIS — E041 Nontoxic single thyroid nodule: Secondary | ICD-10-CM | POA: Diagnosis not present

## 2019-01-20 DIAGNOSIS — Z1389 Encounter for screening for other disorder: Secondary | ICD-10-CM | POA: Diagnosis not present

## 2019-01-24 DIAGNOSIS — Z23 Encounter for immunization: Secondary | ICD-10-CM | POA: Diagnosis not present

## 2019-02-02 ENCOUNTER — Telehealth: Payer: Self-pay | Admitting: Cardiology

## 2019-02-02 NOTE — Telephone Encounter (Signed)
Called the patient and Judy Lindsey

## 2019-02-02 NOTE — Telephone Encounter (Signed)
New Message  STAT if HR is under 50 or over 120 (normal HR is 60-100 beats per minute)  1) What is your heart rate? 76  2) Do you have a log of your heart rate readings (document readings)? 75, 77,75, 199 (last 2 minutes), 65  3) Do you have any other symptoms? No other symptoms, when it spiked she felt "weird".   Appointment scheduled for 02/09/19 at 11:00 am with Judy Kicks FNP

## 2019-02-03 ENCOUNTER — Telehealth: Payer: Self-pay | Admitting: Cardiology

## 2019-02-03 NOTE — Telephone Encounter (Signed)
Follow UP;     Pt is returning Sheleigh's call from yesterday.

## 2019-02-03 NOTE — Telephone Encounter (Signed)
I spoke to the patient and we discussed her upcoming appointment with Mickel Baas to further discuss these moments of rapid HR.  She stressed that these occur infrequently, but would like to discuss possibilities to the cause (medications, etc...)

## 2019-02-08 DIAGNOSIS — M4316 Spondylolisthesis, lumbar region: Secondary | ICD-10-CM | POA: Diagnosis not present

## 2019-02-08 DIAGNOSIS — M47816 Spondylosis without myelopathy or radiculopathy, lumbar region: Secondary | ICD-10-CM | POA: Diagnosis not present

## 2019-02-08 DIAGNOSIS — M5136 Other intervertebral disc degeneration, lumbar region: Secondary | ICD-10-CM | POA: Diagnosis not present

## 2019-02-08 NOTE — Progress Notes (Signed)
Cardiology Office Note   Date:  02/09/2019   ID:  DAWT REEB, DOB 1936/02/19, MRN [id number]  PCP:  Kelton Pillar, MD  Cardiologist:  Dr. Marlou Porch    Chief Complaint  Patient presents with  . Hypertension      History of Present Illness: Judy Lindsey is a 83 y.o. female who presents for rapid HR and elevated BP.   follow-up of event monitor, echocardiogram bradycardia, systolic murmur.  No significant dyspnea on exertion currently.  Echocardiogram 04/07/2018:  - Normal biventricular chamber size and function. Normal biatrial chamber size. Mild-moderate pulmonary valve regurgitation. Mild mitral valve regurgitation. RVSP 27 mmHg. RA pressure estimate 3 mmHg. overall reassuring  Event monitor 04/2018 - No evidence of atrial fibrillation.  Periodic sinus tachycardia into the 130 range.  Bradycardia 49 at 4 AM during sleeping transiently.  No pauses.  Overall reassuring.  Now more freq episodes of tachycardia lasting 2-5 min. She develops uneasy feeling with this, a jittery feelings and feels disoriented.  Her BP today is elevated but at home not always elevated freq 366Q systolic - she is allergic to adhesive so monitoring is difficult  Her Bp is elevated as well.  Though she notes at times in 947M systolic.  No chest pain no SOb   Past Medical History:  Diagnosis Date  . Arthritis   . Fall 2004   mechanical fall, down stair, broke her leg  . Heart murmur   . Hemorrhoids   . History of urinary tract infection   . Hypertension   . Hypothyroidism   . Urinary incontinence     Past Surgical History:  Procedure Laterality Date  . ABDOMINAL HYSTERECTOMY    . ANKLE SURGERY     right had screw placed still has   . APPENDECTOMY    . arm surgery      had excess skin removed from under upper arm area bilat 05/16/2015   . CHOLECYSTECTOMY    . EYE SURGERY     cataract surgery bilat   . HEMORRHOID SURGERY    . HERNIA REPAIR    . TONSILLECTOMY    . TOTAL KNEE  ARTHROPLASTY Left 07/10/2015   Procedure: LEFT TOTAL KNEE ARTHROPLASTY;  Surgeon: Paralee Cancel, MD;  Location: [facility];  Service: Orthopedics;  Laterality: Left;  . tummy tuck      20 years ago      Current Outpatient Medications  Medication Sig Dispense Refill  . benazepril (LOTENSIN) 10 MG tablet Take 10 mg by mouth daily.   0  . betamethasone dipropionate (DIPROLENE) 0.05 % cream Apply 1 application topically daily as needed (eczema).   0  . celecoxib (CELEBREX) 200 MG capsule Take 200mg  by mouth once daily if needed for pain  0  . LORazepam (ATIVAN) 0.5 MG tablet Take 0.5 mg by mouth as needed.    Marland Kitchen omeprazole (PRILOSEC) 40 MG capsule Take 40 mg by mouth daily as needed.    . polyethylene glycol (MIRALAX / GLYCOLAX) packet Take 17 g by mouth 2 (two) times daily. 14 each 0  . rosuvastatin (CRESTOR) 10 MG tablet Take 10 mg by mouth daily.    Marland Kitchen SYNTHROID 100 MCG tablet Take 75 mcg by mouth daily before breakfast.   0  . traMADol (ULTRAM) 50 MG tablet Take 50 mg by mouth every 6 (six) hours as needed for moderate pain.     No current facility-administered medications for this visit.     Allergies:   Codeine  and Nubain [nalbuphine]    Social History:  The patient  reports that she has never smoked. She has never used smokeless tobacco. She reports current alcohol use. She reports that she does not use drugs.   Family History:  The patient's family history includes CAD in her father; Parkinson's disease in her mother.    ROS:  General:no colds or fevers, no weight changes Skin:no rashes or ulcers HEENT:no blurred vision, no congestion CV:see HPI PUL:see HPI GI:no diarrhea constipation or melena, no indigestion GU:no hematuria, no dysuria MS:no joint pain, no claudication Neuro:no syncope, no lightheadedness Endo:no diabetes, + thyroid disease  Wt Readings from Last 3 Encounters:  02/09/19 149 lb 9.6 oz (67.9 kg)  05/19/18 147 lb 1.9 oz (66.7 kg)  04/05/18 145 lb 3.2 oz (65.9  kg)     PHYSICAL EXAM: VS:  BP (!) 170/78   Pulse 67   Ht 5\' 3"  (1.6 m)   Wt 149 lb 9.6 oz (67.9 kg)   SpO2 98%   BMI 26.50 kg/m  , BMI Body mass index is 26.5 kg/m. General:Pleasant affect, NAD Skin:Warm and dry, brisk capillary refill HEENT:normocephalic, sclera clear, mucus membranes moist Neck:supple, no JVD, no bruits  Heart:S1S2 RRR without murmur, gallup, rub or click Lungs:clear without rales, rhonchi, or wheezes , non tender, + BS, do not palpate liver spleen or masses Ext:no lower ext edema, 2+ pedal pulses, 2+ radial pulses Neuro:alert and oriented, MAE, follows commands, + facial symmetry    EKG:  EKG is ordered today. The ekg ordered today demonstrates SR LAD and no significant changes from 04/05/18    Recent Labs: No results found for requested labs within last 8760 hours.    Lipid Panel No results found for: CHOL, TRIG, HDL, CHOLHDL, VLDL, LDLCALC, LDLDIRECT     Other studies Reviewed: Additional studies/ records that were reviewed today include: . Echocardiogram 04/07/2018:  - Normal biventricular chamber size and function. Normal biatrial chamber size. Mild-moderate pulmonary valve regurgitation. Mild mitral valve regurgitation. RVSP 27 mmHg. RA pressure estimate 3 mmHg. overall reassuring  Event monitor 04/2018 - No evidence of atrial fibrillation.  Periodic sinus tachycardia into the 130 range.  Bradycardia 49 at 4 AM during sleeping transiently.  No pauses.  Overall reassuring.   ASSESSMENT AND PLAN:  1.  HTN will increase benazepril to 20 mg daily.  She will keep eye on BP follow up in 3 months with Dr. 05/2018  2.  Rapid HR discussed with DOD Dr. Anne Fu. With her bradycardia difficult to add BB, he recommended Alive.cor and then she can get strips of HR when elevated- we wanted another event monitor but she cannot tolerate the adhesive. She was given name for Alive Cor.  Also discussed decreasing caffeine   3.  Mild MR  stable.     Current medicines are reviewed with the patient today.  The patient Has no concerns regarding medicines.  The following changes have been made:  See above Labs/ tests ordered today include:see above  Disposition:   FU:  see above  Signed, Graciela Husbands, NP  02/09/2019 11:12 AM    [facility] [address] [place], [place], [place]  [place] [address]/ [address], [place] Phone: [phone]; Fax: [phone]  [phone]

## 2019-02-09 ENCOUNTER — Encounter: Payer: Self-pay | Admitting: Cardiology

## 2019-02-09 ENCOUNTER — Other Ambulatory Visit: Payer: Self-pay

## 2019-02-09 ENCOUNTER — Ambulatory Visit (INDEPENDENT_AMBULATORY_CARE_PROVIDER_SITE_OTHER): Payer: Medicare PPO | Admitting: Cardiology

## 2019-02-09 VITALS — BP 170/78 | HR 67 | Ht 63.0 in | Wt 149.6 lb

## 2019-02-09 DIAGNOSIS — R002 Palpitations: Secondary | ICD-10-CM | POA: Diagnosis not present

## 2019-02-09 DIAGNOSIS — I340 Nonrheumatic mitral (valve) insufficiency: Secondary | ICD-10-CM | POA: Diagnosis not present

## 2019-02-09 DIAGNOSIS — R001 Bradycardia, unspecified: Secondary | ICD-10-CM

## 2019-02-09 DIAGNOSIS — I10 Essential (primary) hypertension: Secondary | ICD-10-CM

## 2019-02-09 MED ORDER — BENAZEPRIL HCL 20 MG PO TABS: 20.0000 mg | ORAL_TABLET | Freq: Every day | ORAL | 3 refills | Status: DC

## 2019-02-09 NOTE — Patient Instructions (Addendum)
Medication Instructions:  Your physician has recommended you make the following change in your medication:  1.  INCREASE the Benazepril to 20 mg daily.  You can take 2 of the 10 mg tablets at one time to use them up.   If you need a refill on your cardiac medications before your next appointment, please call your pharmacy.   Lab work: None ordered  If you have labs (blood work) drawn today and your tests are completely normal, you will receive your results only by: Marland Kitchen MyChart Message (if you have MyChart) OR . A paper copy in the mail If you have any lab test that is abnormal or we need to change your treatment, we will call you to review the results.  Testing/Procedures: None ordered   Follow-Up: At [facility], you and your health needs are our priority.  As part of our continuing mission to provide you with exceptional heart care, we have created designated Provider Care Teams.  These Care Teams include your primary Cardiologist (physician) and Advanced Practice Providers (APPs -  Physician Assistants and Nurse Practitioners) who all work together to provide you with the care you need, when you need it. You will need a follow up appointment in 3 months with Dr. Marlou Porch  Any Other Special Instructions Will Be Listed Below (If Applicable).  Go to [email or web address] and get that device

## 2019-02-17 DIAGNOSIS — L218 Other seborrheic dermatitis: Secondary | ICD-10-CM | POA: Diagnosis not present

## 2019-02-17 DIAGNOSIS — R208 Other disturbances of skin sensation: Secondary | ICD-10-CM | POA: Diagnosis not present

## 2019-02-17 DIAGNOSIS — L821 Other seborrheic keratosis: Secondary | ICD-10-CM | POA: Diagnosis not present

## 2019-02-17 DIAGNOSIS — D2271 Melanocytic nevi of right lower limb, including hip: Secondary | ICD-10-CM | POA: Diagnosis not present

## 2019-02-17 DIAGNOSIS — D2261 Melanocytic nevi of right upper limb, including shoulder: Secondary | ICD-10-CM | POA: Diagnosis not present

## 2019-02-17 DIAGNOSIS — D1801 Hemangioma of skin and subcutaneous tissue: Secondary | ICD-10-CM | POA: Diagnosis not present

## 2019-02-17 DIAGNOSIS — D225 Melanocytic nevi of trunk: Secondary | ICD-10-CM | POA: Diagnosis not present

## 2019-02-18 DIAGNOSIS — R300 Dysuria: Secondary | ICD-10-CM | POA: Diagnosis not present

## 2019-03-01 ENCOUNTER — Telehealth: Payer: Self-pay | Admitting: Cardiology

## 2019-03-01 DIAGNOSIS — R002 Palpitations: Secondary | ICD-10-CM

## 2019-03-01 DIAGNOSIS — R001 Bradycardia, unspecified: Secondary | ICD-10-CM

## 2019-03-01 NOTE — Telephone Encounter (Signed)
Let's check a Zio long term, 14 day monitor to correlate with Apple watch.palpitations.  Thanks  Candee Furbish, MD

## 2019-03-01 NOTE — Telephone Encounter (Signed)
Order placed and message sent through MyChart to notify patient she will contacted to be scheduled and with instructions.

## 2019-03-02 ENCOUNTER — Telehealth: Payer: Self-pay | Admitting: Cardiology

## 2019-03-02 NOTE — Telephone Encounter (Signed)
Patients daughter states that her mother has spikes in her heart rate. She states her heart rate got up to 201 last night and again to 156.She also has some dizziness and jittery feelings.

## 2019-03-02 NOTE — Telephone Encounter (Signed)
I spoke with pt's daughter who reports pt has spikes in heart rate at times.  201 was earlier this week.  Other times it has been 120,130,156.  Lasts a few minutes and then goes back to normal.  Daughter has sent Apple watch readings. I told pt's daughter that Dr Marlou Porch has ordered a monitor for pt (see previous phone note) and that our monitor department would be in touch to arrange .

## 2019-03-03 DIAGNOSIS — M47816 Spondylosis without myelopathy or radiculopathy, lumbar region: Secondary | ICD-10-CM | POA: Diagnosis not present

## 2019-03-04 ENCOUNTER — Telehealth: Payer: Self-pay | Admitting: Cardiology

## 2019-03-04 NOTE — Telephone Encounter (Signed)
Patient is calling to set up appt for device monitor.

## 2019-03-09 NOTE — Telephone Encounter (Signed)
14 day ZIO XT long term holter monitor to be mailed to patients home.  Instructions reviewed briefly as they are included in the monitor kit.

## 2019-03-15 ENCOUNTER — Ambulatory Visit (INDEPENDENT_AMBULATORY_CARE_PROVIDER_SITE_OTHER): Payer: Medicare PPO

## 2019-03-15 DIAGNOSIS — R002 Palpitations: Secondary | ICD-10-CM | POA: Diagnosis not present

## 2019-03-15 DIAGNOSIS — R001 Bradycardia, unspecified: Secondary | ICD-10-CM

## 2019-03-17 DIAGNOSIS — M47816 Spondylosis without myelopathy or radiculopathy, lumbar region: Secondary | ICD-10-CM | POA: Diagnosis not present

## 2019-03-17 DIAGNOSIS — M5136 Other intervertebral disc degeneration, lumbar region: Secondary | ICD-10-CM | POA: Diagnosis not present

## 2019-03-17 DIAGNOSIS — M4316 Spondylolisthesis, lumbar region: Secondary | ICD-10-CM | POA: Diagnosis not present

## 2019-04-05 DIAGNOSIS — R002 Palpitations: Secondary | ICD-10-CM | POA: Diagnosis not present

## 2019-04-05 DIAGNOSIS — R001 Bradycardia, unspecified: Secondary | ICD-10-CM | POA: Diagnosis not present

## 2019-05-20 ENCOUNTER — Ambulatory Visit: Payer: Medicare PPO | Admitting: Cardiology

## 2019-05-27 DIAGNOSIS — R300 Dysuria: Secondary | ICD-10-CM | POA: Diagnosis not present

## 2019-06-18 ENCOUNTER — Ambulatory Visit: Payer: Medicare PPO

## 2019-06-28 ENCOUNTER — Telehealth: Payer: Self-pay | Admitting: Cardiology

## 2019-06-28 NOTE — Telephone Encounter (Signed)
New message  Patient would like to know if appt with Dr. Anne Fu on 07/01/19 can be converted to virtual? Please advise?

## 2019-06-28 NOTE — Telephone Encounter (Signed)

## 2019-06-28 NOTE — Telephone Encounter (Signed)
Yes, OK to change to virtual Donato Schultz, MD

## 2019-07-01 ENCOUNTER — Telehealth (INDEPENDENT_AMBULATORY_CARE_PROVIDER_SITE_OTHER): Payer: Medicare PPO | Admitting: Cardiology

## 2019-07-01 ENCOUNTER — Other Ambulatory Visit: Payer: Self-pay

## 2019-07-01 ENCOUNTER — Encounter: Payer: Self-pay | Admitting: Cardiology

## 2019-07-01 VITALS — BP 170/91 | HR 59 | Ht 63.0 in | Wt 145.0 lb

## 2019-07-01 DIAGNOSIS — R002 Palpitations: Secondary | ICD-10-CM | POA: Diagnosis not present

## 2019-07-01 DIAGNOSIS — I10 Essential (primary) hypertension: Secondary | ICD-10-CM | POA: Diagnosis not present

## 2019-07-01 MED ORDER — AMLODIPINE BESYLATE 5 MG PO TABS: 5.0000 mg | ORAL_TABLET | Freq: Every day | ORAL | 3 refills | Status: DC

## 2019-07-01 NOTE — Progress Notes (Signed)
Virtual Visit via Video Note   This visit type was conducted due to national recommendations for restrictions regarding the COVID-19 Pandemic (e.g. social distancing) in an effort to limit this patient's exposure and mitigate transmission in our community.  Due to her co-morbid illnesses, this patient is at least at moderate risk for complications without adequate follow up.  This format is felt to be most appropriate for this patient at this time.  All issues noted in this document were discussed and addressed.  A limited physical exam was performed with this format.  Please refer to the patient's chart for her consent to telehealth for [facility].   Date:  07/01/2019   ID:  Judy Lindsey, DOB [birth year], MRN [id number]  Patient Location: Home Provider Location: Home  PCP:  Kelton Pillar, MD  Cardiologist:  Candee Furbish, MD  Electrophysiologist:  None   Evaluation Performed:  Follow-Up Visit  Chief Complaint: The pressure elevation  History of Present Illness:    Judy Lindsey is a 84 y.o. female prior event monitor with no evidence of atrial fibrillation.  Here for follow-up palpitations.  04/11/2019: Sinus rhythm with average heart rate 68 bpm. Minimum 46 bpm during sleep.  Rare PVCs, rare PACs-occasional symptoms of heart fluttering racing were associated with rare PAC  Rare paroxysmal atrial tachycardia, PAT, benign, fastest lasted 9 beats total at 174 bpm max, average 150 bpm, 3.8 seconds duration.  No evidence of atrial fibrillation or flutter  No significant pauses or significant bradycardia.  Overall reassuring monitor. Very short episodes of paroxysmal atrial tachycardia, benign, no long sustained elevated heart rates  Normal EF  Blood pressure has been high in the 170s.  She is only on benazepril 20 mg a day.  See below.  No chest pain no fevers no chills.  She is not feeling any side effects of high blood pressure no neurologic symptoms no visual  changes.    The patient does not have symptoms concerning for COVID-19 infection (fever, chills, cough, or new shortness of breath).    Past Medical History:  Diagnosis Date  . Arthritis   . Fall 2004   mechanical fall, down stair, broke her leg  . Heart murmur   . Hemorrhoids   . History of urinary tract infection   . Hypertension   . Hypothyroidism   . Urinary incontinence    Past Surgical History:  Procedure Laterality Date  . ABDOMINAL HYSTERECTOMY    . ANKLE SURGERY     right had screw placed still has   . APPENDECTOMY    . arm surgery      had excess skin removed from under upper arm area bilat 05/16/2015   . CHOLECYSTECTOMY    . EYE SURGERY     cataract surgery bilat   . HEMORRHOID SURGERY    . HERNIA REPAIR    . TONSILLECTOMY    . TOTAL KNEE ARTHROPLASTY Left 07/10/2015   Procedure: LEFT TOTAL KNEE ARTHROPLASTY;  Surgeon: Paralee Cancel, MD;  Location: [facility];  Service: Orthopedics;  Laterality: Left;  . tummy tuck      20 years ago      Current Meds  Medication Sig  . benazepril (LOTENSIN) 20 MG tablet Take 1 tablet (20 mg total) by mouth daily.  . betamethasone dipropionate (DIPROLENE) 0.05 % cream Apply 1 application topically daily as needed (eczema).   Marland Kitchen levothyroxine (SYNTHROID) 88 MCG tablet Take 88 mcg by mouth every morning.  Marland Kitchen LORazepam (ATIVAN) 0.5  MG tablet Take 0.5 mg by mouth as needed.  . meloxicam (MOBIC) 15 MG tablet Take 15 mg by mouth daily.  Marland Kitchen omeprazole (PRILOSEC) 40 MG capsule Take 40 mg by mouth daily as needed.  . polyethylene glycol (MIRALAX / GLYCOLAX) packet Take 17 g by mouth 2 (two) times daily.  . rosuvastatin (CRESTOR) 10 MG tablet Take 10 mg by mouth daily.  . traMADol (ULTRAM) 50 MG tablet Take 50 mg by mouth every 6 (six) hours as needed for moderate pain.     Allergies:   Codeine and Nubain [nalbuphine]   Social History   Tobacco Use  . Smoking status: Never Smoker  . Smokeless tobacco: Never Used  Substance Use Topics   . Alcohol use: Yes    Comment: occas glass of wine   . Drug use: No     Family Hx: The patient's family history includes CAD in her father; Parkinson's disease in her mother.  ROS:   Please see the history of present illness.     All other systems reviewed and are negative.   Prior CV studies:   The following studies were reviewed today:  Normal EF  Labs/Other Tests and Data Reviewed:    EKG:  No ECG reviewed.  Recent Labs: No results found for requested labs within last 8760 hours.   Recent Lipid Panel No results found for: CHOL, TRIG, HDL, CHOLHDL, LDLCALC, LDLDIRECT  Wt Readings from Last 3 Encounters:  07/01/19 145 lb (65.8 kg)  02/09/19 149 lb 9.6 oz (67.9 kg)  05/19/18 147 lb 1.9 oz (66.7 kg)     Objective:    Vital Signs:  BP (!) 170/91   Pulse (!) 59   Ht 5\' 3"  (1.6 m)   Wt 145 lb (65.8 kg)   BMI 25.69 kg/m    VITAL SIGNS:  reviewed GEN:  no acute distress EYES:  sclerae anicteric, EOMI - Extraocular Movements Intact RESPIRATORY:  normal respiratory effort, symmetric expansion SKIN:  no rash, lesions or ulcers. MUSCULOSKELETAL:  no obvious deformities. NEURO:  alert and oriented x 3, no obvious focal deficit PSYCH:  normal affect  ASSESSMENT & PLAN:    Rapid heart rate-previously discussed with Dr. .  With her bradycardia.  It has been difficult to add beta-blocker.  Overall this has been stable without any spike in recent heart rate.  Prior November event monitor reviewed.  Overall reassuring.  These are likely episodes of paroxysmal atrial tachycardia.  Thankfully, this is not atrial fibrillation detected.  Essential hypertension uncontrolled -We will go ahead and add amlodipine 5 mg once a day to her regimen.  I told her about possibility of ankle swelling. -Continue with benazepril 20 mg.  She will continue to monitor blood pressures closely.  In 1 month, we will have her follow-up with virtual visit, December, NP. -Next change could  be to add HCTZ 12.5 once a day.    COVID-19 Education: The signs and symptoms of COVID-19 were discussed with the patient and how to seek care for testing (follow up with PCP or arrange E-visit).  The importance of social distancing was discussed today.  Time:   Today, I have spent 15 minutes with the patient with telehealth technology discussing the above problems.     Medication Adjustments/Labs and Tests Ordered: Current medicines are reviewed at length with the patient today.  Concerns regarding medicines are outlined above.   Tests Ordered: No orders of the defined types were placed in this encounter.  Medication Changes: Meds ordered this encounter  Medications  . amLODipine (NORVASC) 5 MG tablet    Sig: Take 1 tablet (5 mg total) by mouth daily.    Dispense:  90 tablet    Refill:  3    Follow Up:  Virtual Visit  in 1 month(s)  Signed, Donato Schultz, MD  07/01/2019 2:09 PM    [facility]

## 2019-07-01 NOTE — Patient Instructions (Signed)
Medication Instructions:  Please start Amlodipine 5 mg a day. Continue all other medications as listed.  *If you need a refill on your cardiac medications before your next appointment, please call your pharmacy*  Follow-Up: At [facility], you and your health needs are our priority.  As part of our continuing mission to provide you with exceptional heart care, we have created designated Provider Care Teams.  These Care Teams include your primary Cardiologist (physician) and Advanced Practice Providers (APPs -  Physician Assistants and Nurse Practitioners) who all work together to provide you with the care you need, when you need it.  Your next appointment:   4 week(s)  The format for your next appointment:   Virtual Visit   Provider:   Nada Boozer, NP  Thank you for choosing [facility]!!

## 2019-07-12 ENCOUNTER — Telehealth: Payer: Self-pay | Admitting: Cardiology

## 2019-07-12 MED ORDER — AMLODIPINE BESYLATE 5 MG PO TABS: 2.5000 mg | ORAL_TABLET | Freq: Every day | ORAL | 3 refills | Status: DC

## 2019-07-12 NOTE — Telephone Encounter (Signed)
Reviewed information with Dr Anne Fu who gives verbal orders to decrease Amlodipine to 2.5 mg daily and monitor symptoms. Pt aware and agreeable.

## 2019-07-12 NOTE — Telephone Encounter (Signed)
New Message    Pt c/o medication issue:  1. Name of Medication: amLODipine (NORVASC) 5 MG tablet  2. How are you currently taking this medication (dosage and times per day)? 5 mg 1 x daily   3. Are you having a reaction (difficulty breathing--STAT)? No  4. What is your medication issue? Pt is calling and says she is experiencing a lot of dizziness from this medication    Please call

## 2019-07-12 NOTE — Telephone Encounter (Signed)
Spoke with patient who reports 3 to 4 days after starting the medication she noticed dizziness with position changes. Esp going from laying to standing.  Dizziness has also occurred even "rolling over in bed."  She held the medication for a few days and felt better.  The dizziness has re-occurred since starting back.  BP has been 141/73 and 132/65 - HR has been "fine"   She denies being dehydrated.  Advised patient to use caution with position changes.  Aware I will have Dr Anne Fu review and c/b with further orders.  Pt states understanding.

## 2019-07-20 ENCOUNTER — Telehealth (INDEPENDENT_AMBULATORY_CARE_PROVIDER_SITE_OTHER): Payer: Medicare PPO | Admitting: Family Medicine

## 2019-07-20 ENCOUNTER — Other Ambulatory Visit: Payer: Self-pay

## 2019-07-20 ENCOUNTER — Encounter: Payer: Self-pay | Admitting: Cardiology

## 2019-07-20 VITALS — BP 150/69 | HR 62 | Ht 63.0 in | Wt 145.0 lb

## 2019-07-20 DIAGNOSIS — R002 Palpitations: Secondary | ICD-10-CM | POA: Diagnosis not present

## 2019-07-20 DIAGNOSIS — I10 Essential (primary) hypertension: Secondary | ICD-10-CM

## 2019-07-20 NOTE — Patient Instructions (Signed)
Medication Instructions:  Your physician recommends that you continue on your current medications as directed. Please refer to the Current Medication list given to you today.  *If you need a refill on your cardiac medications before your next appointment, please call your pharmacy*   Lab Work: None ordered   If you have labs (blood work) drawn today and your tests are completely normal, you will receive your results only by: . MyChart Message (if you have MyChart) OR . A paper copy in the mail If you have any lab test that is abnormal or we need to change your treatment, we will call you to review the results.   Testing/Procedures: None ordered   Follow-Up: At [facility], you and your health needs are our priority.  As part of our continuing mission to provide you with exceptional heart care, we have created designated Provider Care Teams.  These Care Teams include your primary Cardiologist (physician) and Advanced Practice Providers (APPs -  Physician Assistants and Nurse Practitioners) who all work together to provide you with the care you need, when you need it.  Your next appointment:   6 month(s)  The format for your next appointment:   In Person  Provider:   You may see Mark Skains, MD or one of the following Advanced Practice Providers on your designated Care Team:    Lori Gerhardt, NP  Laura Ingold, NP  Jill McDaniel, NP    Other Instructions We recommend signing up for the patient portal called "MyChart".  Sign up information is provided on this After Visit Summary.  MyChart is used to connect with patients for Virtual Visits (Telemedicine).  Patients are able to view lab/test results, encounter notes, upcoming appointments, etc.  Non-urgent messages can be sent to your provider as well.   To learn more about what you can do with MyChart, go to [email or web address].    

## 2019-07-20 NOTE — Progress Notes (Signed)
Virtual Visit via Telephone Note   This visit type was conducted due to national recommendations for restrictions regarding the COVID-19 Pandemic (e.g. social distancing) in an effort to limit this patient's exposure and mitigate transmission in our community.  Due to her co-morbid illnesses, this patient is at least at moderate risk for complications without adequate follow up.  This format is felt to be most appropriate for this patient at this time.  The patient did not have access to video technology/had technical difficulties with video requiring transitioning to audio format only (telephone).  All issues noted in this document were discussed and addressed.  No physical exam could be performed with this format.  Please refer to the patient's chart for her  consent to telehealth for [facility].   The patient was identified using 2 identifiers.  Date:  07/20/2019   ID:  Judy Lindsey, DOB 1936-02-16, MRN [id number]  Patient Location: Home Provider Location: Home  PCP:  Maurice Small, MD  Cardiologist:  Donato Schultz, MD  Electrophysiologist:  None   Evaluation Performed:  Follow-Up Visit  Chief Complaint:  HTN  History of Present Illness:    Judy Lindsey is a 84 y.o. female with history of rapid heart beat and elevated blood pressure.  Last office visit inSeptember 2020 with Nada Boozer for follow-up of event monitor, echocardiogram, bradycardia,and systolic murmur.  At that visit she was complaining of no significant dyspnea on exertion.  Recently had an event monitor 03/15/2019 which showed no significant bradycardia or tachyarrhythmias, pauses, high degree heart blocks.  She had rare paroxysmal atrial tachycardia which was benign lasting for approximately 9 beats.  Blood pressure was increased at last office visit and benazepril was increased to 20 mg.  Case was discussed with Dr. Graciela Husbands.  With her bradycardia difficulty and beta-blocker, he recommended Alive Cor and then she  can get strips of heart rate when elevated.  They wanted another event monitor patient states she cannot tolerate the adhesive.  She was given the name for Alive Cor.  Also discussed decreasing caffeine intake.  Recent telemedicine visit July 01, 2019 with Dr. Anne Fu.  Blood pressure continue to be elevated and Dr. Anne Fu added amlodipine 5 mg once a day for blood pressure control.Marland Kitchen  He recommended next changes if blood pressure need to be controlled better was to add HCTZ 12.5 mg once per day.  Patient states her blood pressure has improved.  States she recently had blood pressures of 130/68-129/68, 137/70 on the first few days of March.  States systolic BP has been generally in the 140s systolic.  She is reticent to start HCTZ now due to the fact he states she already goes to urinate quite often.  She prefers to defer for now.  She states she is having some issues with sleep and thinks a sleep aid might help.  She states her episodes of palpitations have significantly decreased and has rare instances of transient palpitations.  He was unable to use a alive core device due to defective equipment.  She states she sent the equipment back to the company.  The patient does have symptoms concerning for COVID-19 infection (fever, chills, cough, or new shortness of breath).    Past Medical History:  Diagnosis Date  . Arthritis   . Fall 2004   mechanical fall, down stair, broke her leg  . Heart murmur   . Hemorrhoids   . History of urinary tract infection   . Hypertension   .  Hypothyroidism   . Urinary incontinence    Past Surgical History:  Procedure Laterality Date  . ABDOMINAL HYSTERECTOMY    . ANKLE SURGERY     right had screw placed still has   . APPENDECTOMY    . arm surgery      had excess skin removed from under upper arm area bilat 05/16/2015   . CHOLECYSTECTOMY    . EYE SURGERY     cataract surgery bilat   . HEMORRHOID SURGERY    . HERNIA REPAIR    . TONSILLECTOMY    . TOTAL  KNEE ARTHROPLASTY Left 07/10/2015   Procedure: LEFT TOTAL KNEE ARTHROPLASTY;  Surgeon: Durene Romans, MD;  Location: [facility];  Service: Orthopedics;  Laterality: Left;  . tummy tuck      20 years ago      Current Meds  Medication Sig  . amLODipine (NORVASC) 5 MG tablet Take 0.5 tablets (2.5 mg total) by mouth daily.  . benazepril (LOTENSIN) 20 MG tablet Take 1 tablet (20 mg total) by mouth daily.  . betamethasone dipropionate (DIPROLENE) 0.05 % cream Apply 1 application topically daily as needed (eczema).   Marland Kitchen levothyroxine (SYNTHROID) 88 MCG tablet Take 88 mcg by mouth every morning.  Marland Kitchen LORazepam (ATIVAN) 0.5 MG tablet Take 0.5 mg by mouth as needed.  . meloxicam (MOBIC) 15 MG tablet Take 15 mg by mouth daily as needed for pain.   Marland Kitchen omeprazole (PRILOSEC) 40 MG capsule Take 40 mg by mouth daily as needed.  . Probiotic Product (ALIGN PO) Take 1 tablet by mouth daily.  . rosuvastatin (CRESTOR) 10 MG tablet Take 10 mg by mouth daily.  . traMADol (ULTRAM) 50 MG tablet Take 50 mg by mouth every 6 (six) hours as needed for moderate pain.     Allergies:   Codeine and Nubain [nalbuphine]   Social History   Tobacco Use  . Smoking status: Never Smoker  . Smokeless tobacco: Never Used  Substance Use Topics  . Alcohol use: Yes    Comment: occas glass of wine   . Drug use: No     Family Hx: The patient's family history includes CAD in her father; Parkinson's disease in her mother.  ROS:   Please see the history of present illness.    All other systems reviewed and are negative.   Prior CV studies:   The following studies were reviewed today:  Long-term event monitor placed on 03/15/2019 Study Highlights    Sinus rhythm with average heart rate 68 bpm. Minimum 46 bpm during sleep.  Rare PVCs, rare PACs-occasional symptoms of heart fluttering racing were associated with rare PAC  Rare paroxysmal atrial tachycardia, PAT, benign, fastest lasted 9 beats total at 174 bpm max, average 150  bpm, 3.8 seconds duration.  No evidence of atrial fibrillation or flutter  No significant pauses or significant bradycardia.  Overall reassuring monitor. Very short episodes of paroxysmal atrial tachycardia, benign, no long sustained elevated heart rates   Donato Schultz, MD      Echocardiogram on 04/07/2018 Study Conclusions   - Left ventricle: The cavity size was normal. Wall thickness was  normal. Systolic function was normal. The estimated ejection  fraction was in the range of 60% to 65%. Wall motion was normal;  there were no regional wall motion abnormalities. Doppler  parameters are consistent with abnormal left ventricular  relaxation (grade 1 diastolic dysfunction). Doppler parameters  are consistent with high ventricular filling pressure.  - Aortic valve: Moderately calcified annulus.  Trileaflet; normal  thickness, mildly calcified leaflets. Mobility was not  restricted. Transvalvular velocity was within the normal range.  There was no stenosis. There was no regurgitation. Mean gradient  (S): 9 mm Hg.  - Mitral valve: Mildly calcified annulus. Mobility was not  restricted. Transvalvular velocity was within the normal range.  There was no evidence for stenosis. There was mild regurgitation.  - Left atrium: The atrium was normal in size.  - Right ventricle: The cavity size was normal. Wall thickness was  normal. Systolic function was normal. RV systolic pressure (S,  est): 27 mm Hg.  - Right atrium: The atrium was normal in size. Central venous  pressure (est): 3 mm Hg.  - Tricuspid valve: There was trivial regurgitation.  - Pulmonic valve: There was mild to moderate regurgitation.  - Pulmonary arteries: The main pulmonary artery was normal-sized.  Systolic pressure was within the normal range.  - Inferior vena cava: The vessel was normal in size. The  respirophasic diameter changes were in the normal range (>= 50%),  consistent  with normal central venous pressure.  - Pericardium, extracardiac: There was no pericardial effusion.   Impressions:   - Normal biventricular chamber size and function. Normal biatrial  chamber size. Mild-moderate pulmonary valve regurgitation. Mild  mitral valve regurgitation. RVSP 27 mmHg. RA pressure estimate 3  mmHg.   Labs/Other Tests and Data Reviewed:    EKG:  None today  Recent Labs: No results found for requested labs within last 8760 hours.   Recent Lipid Panel No results found for: CHOL, TRIG, HDL, CHOLHDL, LDLCALC, LDLDIRECT  Wt Readings from Last 3 Encounters:  07/20/19 145 lb (65.8 kg)  07/01/19 145 lb (65.8 kg)  02/09/19 149 lb 9.6 oz (67.9 kg)     Objective:    Vital Signs:  BP (!) 150/69   Pulse 62   Ht 5\' 3"  (1.6 m)   Wt 145 lb (65.8 kg)   BMI 25.69 kg/m    VITAL SIGNS:  reviewed   Patient has normal speech pattern and responding appropriately to questions.  No evidence during phone call of dyspnea, coughing, wheezing.  ASSESSMENT & PLAN:    1. Palpitations Patient states her palpitations are much better now and much less frequent.  She tried the active core device which defective per her statement.  States she sent the equipment back to the company.  2. Essential hypertension States her blood pressure has improved since starting the amlodipine.  He states she had some recent blood pressures at home in early March of 130/68-129/68-137/70.  Blood pressure today is 150/69.  Patient states yesterday her home blood pressure in a.m. was 152/74.  She is stating that systolic pressure is running an average around 140.  We discussed adding on HCTZ per Dr. Marlou Porch recommendation.  She prefers to defer due to already making frequent trips to the bathroom.  He states she is having some issues with sleep disturbance and some sinus issues.  Thinks this may have contributed to her elevated blood pressure to some degree.  Also having some back issues taking  tramadol which may contribute to increased blood pressure.  Advised patient to check her blood pressures daily and if averages are sustained at or above 140/90 further medication needs to be taking to control blood pressure.  COVID-19 Education: The signs and symptoms of COVID-19 were discussed with the patient and how to seek care for testing (follow up with PCP or arrange E-visit).  The importance of social  distancing was discussed today.  Time:   Today, I have spent 15 minutes with the patient with telehealth technology discussing the above problems.     Medication Adjustments/Labs and Tests Ordered: Current medicines are reviewed at length with the patient today.  Concerns regarding medicines are outlined above.   Tests Ordered: No orders of the defined types were placed in this encounter.   Medication Changes: No orders of the defined types were placed in this encounter.   Follow Up:  Either In Person or Virtual in 6 month(s)  Signed, Netta Neat, NP  07/20/2019 9:49 AM    [facility]

## 2019-07-21 DIAGNOSIS — F418 Other specified anxiety disorders: Secondary | ICD-10-CM | POA: Diagnosis not present

## 2019-07-21 DIAGNOSIS — I10 Essential (primary) hypertension: Secondary | ICD-10-CM | POA: Diagnosis not present

## 2019-07-21 DIAGNOSIS — G479 Sleep disorder, unspecified: Secondary | ICD-10-CM | POA: Diagnosis not present

## 2019-07-21 DIAGNOSIS — E7800 Pure hypercholesterolemia, unspecified: Secondary | ICD-10-CM | POA: Diagnosis not present

## 2019-07-21 DIAGNOSIS — R42 Dizziness and giddiness: Secondary | ICD-10-CM | POA: Diagnosis not present

## 2019-07-21 DIAGNOSIS — L659 Nonscarring hair loss, unspecified: Secondary | ICD-10-CM | POA: Diagnosis not present

## 2019-07-21 DIAGNOSIS — M25539 Pain in unspecified wrist: Secondary | ICD-10-CM | POA: Diagnosis not present

## 2019-07-21 DIAGNOSIS — E039 Hypothyroidism, unspecified: Secondary | ICD-10-CM | POA: Diagnosis not present

## 2019-08-11 DIAGNOSIS — M19049 Primary osteoarthritis, unspecified hand: Secondary | ICD-10-CM | POA: Diagnosis not present

## 2019-08-11 DIAGNOSIS — R200 Anesthesia of skin: Secondary | ICD-10-CM | POA: Diagnosis not present

## 2019-09-07 DIAGNOSIS — G5603 Carpal tunnel syndrome, bilateral upper limbs: Secondary | ICD-10-CM | POA: Diagnosis not present

## 2019-09-08 DIAGNOSIS — G5603 Carpal tunnel syndrome, bilateral upper limbs: Secondary | ICD-10-CM | POA: Diagnosis not present

## 2019-09-09 DIAGNOSIS — Z961 Presence of intraocular lens: Secondary | ICD-10-CM | POA: Diagnosis not present

## 2019-09-09 DIAGNOSIS — H524 Presbyopia: Secondary | ICD-10-CM | POA: Diagnosis not present

## 2019-11-28 DIAGNOSIS — Z1231 Encounter for screening mammogram for malignant neoplasm of breast: Secondary | ICD-10-CM | POA: Diagnosis not present

## 2019-12-08 DIAGNOSIS — M47816 Spondylosis without myelopathy or radiculopathy, lumbar region: Secondary | ICD-10-CM | POA: Diagnosis not present

## 2019-12-08 DIAGNOSIS — M4316 Spondylolisthesis, lumbar region: Secondary | ICD-10-CM | POA: Diagnosis not present

## 2019-12-20 ENCOUNTER — Telehealth: Payer: Self-pay | Admitting: Cardiology

## 2019-12-20 DIAGNOSIS — I10 Essential (primary) hypertension: Secondary | ICD-10-CM

## 2019-12-20 DIAGNOSIS — Z79899 Other long term (current) drug therapy: Secondary | ICD-10-CM

## 2019-12-20 NOTE — Telephone Encounter (Signed)
Spoke with patient regarding her BP.  She reports it generally runs 140-150/60-70.  The reading this AM she does not feel as though it use actual because she repeated it and it was closer to her normal.  In March she had been having dizziness with position changes. He Amlodipine was decrease to 2.5 mg from 5 mg daily.  She is asking if this should be increased back to 5 mg daily with the readings she has been getting lately.  She believes the dizziness she had was r/t side effect from the Covid shot because it occurred for a few days after the injection but resolved in a few days.  Advised I will with Dr Anne Fu to review and c/b with further orders/instruction.

## 2019-12-20 NOTE — Telephone Encounter (Signed)
Patient calling states 6 months ago BP medication was adjusted BP is still high.  Pt c/o BP issue: STAT if pt c/o blurred vision, one-sided weakness or slurred speech  1. What are your last 5 BP readings? 154/75, 145/69, 141/83, this morning 174/107 may have been a miss read  2. Are you having any other symptoms (ex. Dizziness, headache, blurred vision, passed out)? Swelling in feet and ankles, may have gained 2 or 3 lbs in the last few weeks, not sleeping well  3. What is your BP issue? Patient states her BP medication was adjusted about 6 months ago. She states her BP is still running high and believes it may need to be adjusted again.

## 2019-12-21 MED ORDER — HYDROCHLOROTHIAZIDE 25 MG PO TABS: 12.5000 mg | ORAL_TABLET | Freq: Every day | ORAL | 6 refills | Status: DC

## 2019-12-21 NOTE — Telephone Encounter (Signed)
Pt is aware of orders for HCTZ, BMP in 1 to 2 weeks with f/u with APP for BP control.  RX to be sent into [facility] per her request.  Pt aware she will be c/b to be scheduled for lab and f/u appt.

## 2019-12-21 NOTE — Telephone Encounter (Signed)
Start HCTZ 12.5 mg PO QD Have her follow-up with basic metabolic profile in 1 to 2 weeks and APP visit to check her blood pressure. Donato Schultz, MD

## 2019-12-21 NOTE — Telephone Encounter (Signed)
Increase amlodipine back to 5mg  PO QD Thanks , MD

## 2019-12-21 NOTE — Telephone Encounter (Signed)
Pt has been scheduled for f/u 8/25 at 8:40 am with Dr Anne Fu.  She will have her BMP the same day.

## 2019-12-21 NOTE — Telephone Encounter (Signed)
Patient called back returning Pam's Call. Call was transferred to PAm

## 2019-12-21 NOTE — Telephone Encounter (Signed)
Called pt to review Dr Anne Fu' order to increase Amlodipine to 5 mg daily.  Per pt - now reports she never decreased the Amlodipine as instructed in 07/2019 d/t dizziness with position changes.  She has been taking 5 mg daily since she began the medication.  With this now coming to light and BP is elevated will have Dr Anne Fu reassess for better BP control.

## 2019-12-29 DIAGNOSIS — R109 Unspecified abdominal pain: Secondary | ICD-10-CM | POA: Diagnosis not present

## 2019-12-29 DIAGNOSIS — K2970 Gastritis, unspecified, without bleeding: Secondary | ICD-10-CM | POA: Diagnosis not present

## 2019-12-29 DIAGNOSIS — E860 Dehydration: Secondary | ICD-10-CM | POA: Diagnosis not present

## 2020-01-04 ENCOUNTER — Ambulatory Visit (INDEPENDENT_AMBULATORY_CARE_PROVIDER_SITE_OTHER): Payer: Medicare PPO | Admitting: Cardiology

## 2020-01-04 ENCOUNTER — Encounter: Payer: Self-pay | Admitting: Cardiology

## 2020-01-04 ENCOUNTER — Other Ambulatory Visit: Payer: Self-pay

## 2020-01-04 VITALS — BP 110/60 | HR 61 | Ht 63.0 in | Wt 142.8 lb

## 2020-01-04 DIAGNOSIS — R011 Cardiac murmur, unspecified: Secondary | ICD-10-CM

## 2020-01-04 DIAGNOSIS — I10 Essential (primary) hypertension: Secondary | ICD-10-CM | POA: Diagnosis not present

## 2020-01-04 NOTE — Progress Notes (Signed)
Cardiology Office Note:    Date:  01/04/2020   ID:  Judy Lindsey, DOB 1935/10/15, MRN [id number]  PCP:  Maurice Small, MD  [facility] Cardiologist:  Donato Schultz, MD  [facility] Electrophysiologist:  None   Referring MD: Maurice Small, MD     History of Present Illness:    Judy Lindsey is a 84 y.o. female here for hypertension follow-up, after change in medications.  Her main doctor, Dr. Valentina Lucks has stopped her diuretic HCTZ 12.5 mg once a day.  Stated that she showed signs of volume depletion.  I had restarted her amlodipine at 2.5 mg.  Previously.  She had called with blood pressures of 150 systolic.    Past Medical History:  Diagnosis Date   Arthritis    Fall 2004   mechanical fall, down stair, broke her leg   Heart murmur    Hemorrhoids    History of urinary tract infection    Hypertension    Hypothyroidism    Urinary incontinence     Past Surgical History:  Procedure Laterality Date   ABDOMINAL HYSTERECTOMY     ANKLE SURGERY     right had screw placed still has    APPENDECTOMY     arm surgery      had excess skin removed from under upper arm area bilat 05/16/2015    CHOLECYSTECTOMY     EYE SURGERY     cataract surgery bilat    HEMORRHOID SURGERY     HERNIA REPAIR     TONSILLECTOMY     TOTAL KNEE ARTHROPLASTY Left 07/10/2015   Procedure: LEFT TOTAL KNEE ARTHROPLASTY;  Surgeon: Durene Romans, MD;  Location: [facility];  Service: Orthopedics;  Laterality: Left;   tummy tuck      20 years ago     Current Medications: Current Meds  Medication Sig   amLODipine (NORVASC) 5 MG tablet Take 0.5 tablets (2.5 mg total) by mouth daily.   benazepril (LOTENSIN) 20 MG tablet Take 1 tablet (20 mg total) by mouth daily.   betamethasone dipropionate (DIPROLENE) 0.05 % cream Apply 1 application topically daily as needed (eczema).    levothyroxine (SYNTHROID) 88 MCG tablet Take 88 mcg by mouth every morning.   LORazepam (ATIVAN) 0.5 MG tablet  Take 0.5 mg by mouth as needed.   omeprazole (PRILOSEC) 40 MG capsule Take 40 mg by mouth daily as needed.   Probiotic Product (ALIGN PO) Take 1 tablet by mouth daily.   rosuvastatin (CRESTOR) 10 MG tablet Take 10 mg by mouth daily.   traMADol (ULTRAM) 50 MG tablet Take 50 mg by mouth every 6 (six) hours as needed for moderate pain.   [DISCONTINUED] hydrochlorothiazide (HYDRODIURIL) 25 MG tablet Take 0.5 tablets (12.5 mg total) by mouth daily.     Allergies:   Codeine and Nubain [nalbuphine]   Social History   Socioeconomic History   Marital status: Married    Spouse name: Not on file   Number of children: Not on file   Years of education: Not on file   Highest education level: Not on file  Occupational History   Not on file  Tobacco Use   Smoking status: Never Smoker   Smokeless tobacco: Never Used  Substance and Sexual Activity   Alcohol use: Yes    Comment: occas glass of wine    Drug use: No   Sexual activity: Not on file  Other Topics Concern   Not on file  Social History Narrative  Not on file   Social Determinants of Health   Financial Resource Strain:    Difficulty of Paying Living Expenses: Not on file  Food Insecurity:    Worried About Running Out of Food in the Last Year: Not on file   Ran Out of Food in the Last Year: Not on file  Transportation Needs:    Lack of Transportation (Medical): Not on file   Lack of Transportation (Non-Medical): Not on file  Physical Activity:    Days of Exercise per Week: Not on file   Minutes of Exercise per Session: Not on file  Stress:    Feeling of Stress : Not on file  Social Connections:    Frequency of Communication with Friends and Family: Not on file   Frequency of Social Gatherings with Friends and Family: Not on file   Attends Religious Services: Not on file   Active Member of Clubs or Organizations: Not on file   Attends Banker Meetings: Not on file   Marital  Status: Not on file     Family History: The patient's family history includes CAD in her father; Parkinson's disease in her mother.  ROS:   Please see the history of present illness.     All other systems reviewed and are negative.  EKGs/Labs/Other Studies Reviewed:    The following studies were reviewed today: Prior echocardiogram as described below  EKG:  EKG is  ordered today.  The ekg ordered today demonstrates sinus rhythm 61 with nonspecific ST-T wave changes no change from prior.  Recent Labs: No results found for requested labs within last 8760 hours.  Recent Lipid Panel No results found for: CHOL, TRIG, HDL, CHOLHDL, VLDL, LDLCALC, LDLDIRECT  Physical Exam:    VS:  BP 110/60    Pulse 61    Ht 5\' 3"  (1.6 m)    Wt 142 lb 12.8 oz (64.8 kg)    SpO2 93%    BMI 25.30 kg/m     Wt Readings from Last 3 Encounters:  01/04/20 142 lb 12.8 oz (64.8 kg)  07/20/19 145 lb (65.8 kg)  07/01/19 145 lb (65.8 kg)     GEN:  Well nourished, well developed in no acute distress HEENT: Normal NECK: No JVD; No carotid bruits LYMPHATICS: No lymphadenopathy CARDIAC: RRR, 2/6 systolic murmur, no rubs, gallops RESPIRATORY:  Clear to auscultation without rales, wheezing or rhonchi  ABDOMEN: Soft, non-tender, non-distended MUSCULOSKELETAL:  No edema; No deformity  SKIN: Warm and dry NEUROLOGIC:  Alert and oriented x 3 PSYCHIATRIC:  Normal affect   ASSESSMENT:    1. Systolic murmur   2. Essential hypertension    PLAN:    In order of problems listed above:  Essential hypertension -Today blood pressure is 110/60.  She is no longer taking the HCTZ 12.5.  This was stopped previously by Dr. 07/03/19 because of recent diarrhea episode.  She is taking the amlodipine 2.5 as well as benazepril 20.  We will take the HCTZ off of her list.  I think that it would be best for her to continue to correspond with Dr. Valentina Lucks her primary care doctor regarding her blood pressure.  This will allow for a  single approach versus too many people trying to adjust the medications.  Of course, please let me know if I can be of further assistance.  Heart murmur -Aortic sclerosis previously noted with mean gradient 9 mmHg 2 years ago.  We will go ahead and repeat echocardiogram given heart  murmur to make sure aortic stenosis is not developing.  We will continue to monitor.  Palpitations -Doing better, no complaints.  Hyperlipidemia -Continue with Crestor 10 mg once a day outside labs show LDL of 117, creatinine 0.54 potassium 3.6 ALT 8   Medication Adjustments/Labs and Tests Ordered: Current medicines are reviewed at length with the patient today.  Concerns regarding medicines are outlined above.  Orders Placed This Encounter  Procedures   EKG 12-Lead   ECHOCARDIOGRAM COMPLETE   No orders of the defined types were placed in this encounter.   Patient Instructions  Medication Instructions:  The current medical regimen is effective;  continue present plan and medications.  *If you need a refill on your cardiac medications before your next appointment, please call your pharmacy*  Testing/Procedures: Your physician has requested that you have an echocardiogram. Echocardiography is a painless test that uses sound waves to create images of your heart. It provides your doctor with information about the size and shape of your heart and how well your hearts chambers and valves are working. This procedure takes approximately one hour. There are no restrictions for this procedure.  Follow-Up: At [facility], you and your health needs are our priority.  As part of our continuing mission to provide you with exceptional heart care, we have created designated Provider Care Teams.  These Care Teams include your primary Cardiologist (physician) and Advanced Practice Providers (APPs -  Physician Assistants and Nurse Practitioners) who all work together to provide you with the care you need, when you need  it.  We recommend signing up for the patient portal called "MyChart".  Sign up information is provided on this After Visit Summary.  MyChart is used to connect with patients for Virtual Visits (Telemedicine).  Patients are able to view lab/test results, encounter notes, upcoming appointments, etc.  Non-urgent messages can be sent to your provider as well.   To learn more about what you can do with MyChart, go to [email or web address].    Your next appointment:   12 month(s)  The format for your next appointment:   In Person  Provider:   You may see Donato Schultz, MD or one of the following Advanced Practice Providers on your designated Care Team:    Norma Fredrickson, NP  Nada Boozer, NP  Georgie Chard, NP    Thank you for choosing [facility]!!         Signed, Donato Schultz, MD  01/04/2020 11:07 AM    [facility]

## 2020-01-04 NOTE — Patient Instructions (Signed)
Medication Instructions:  The current medical regimen is effective;  continue present plan and medications.  *If you need a refill on your cardiac medications before your next appointment, please call your pharmacy*  Testing/Procedures: Your physician has requested that you have an echocardiogram. Echocardiography is a painless test that uses sound waves to create images of your heart. It provides your doctor with information about the size and shape of your heart and how well your hearts chambers and valves are working. This procedure takes approximately one hour. There are no restrictions for this procedure.  Follow-Up: At [facility], you and your health needs are our priority.  As part of our continuing mission to provide you with exceptional heart care, we have created designated Provider Care Teams.  These Care Teams include your primary Cardiologist (physician) and Advanced Practice Providers (APPs -  Physician Assistants and Nurse Practitioners) who all work together to provide you with the care you need, when you need it.  We recommend signing up for the patient portal called "MyChart".  Sign up information is provided on this After Visit Summary.  MyChart is used to connect with patients for Virtual Visits (Telemedicine).  Patients are able to view lab/test results, encounter notes, upcoming appointments, etc.  Non-urgent messages can be sent to your provider as well.   To learn more about what you can do with MyChart, go to [email or web address].    Your next appointment:   12 month(s)  The format for your next appointment:   In Person  Provider:   You may see Donato Schultz, MD or one of the following Advanced Practice Providers on your designated Care Team:    Norma Fredrickson, NP  Nada Boozer, NP  Georgie Chard, NP    Thank you for choosing [facility]!!

## 2020-01-20 ENCOUNTER — Ambulatory Visit (HOSPITAL_COMMUNITY): Payer: Medicare PPO | Attending: Cardiology

## 2020-01-20 ENCOUNTER — Other Ambulatory Visit: Payer: Self-pay

## 2020-01-20 DIAGNOSIS — R011 Cardiac murmur, unspecified: Secondary | ICD-10-CM | POA: Insufficient documentation

## 2020-01-20 LAB — ECHOCARDIOGRAM COMPLETE
AR max vel: 1.52 cm2
AV Area VTI: 1.56 cm2
AV Area mean vel: 1.53 cm2
AV Mean grad: 11 mmHg
AV Peak grad: 23.2 mmHg
Ao pk vel: 2.41 m/s
Area-P 1/2: 4.12 cm2
S' Lateral: 2.8 cm

## 2020-01-23 DIAGNOSIS — Z0000 Encounter for general adult medical examination without abnormal findings: Secondary | ICD-10-CM | POA: Diagnosis not present

## 2020-01-23 DIAGNOSIS — Z1389 Encounter for screening for other disorder: Secondary | ICD-10-CM | POA: Diagnosis not present

## 2020-01-23 DIAGNOSIS — M1990 Unspecified osteoarthritis, unspecified site: Secondary | ICD-10-CM | POA: Diagnosis not present

## 2020-01-23 DIAGNOSIS — M4802 Spinal stenosis, cervical region: Secondary | ICD-10-CM | POA: Diagnosis not present

## 2020-01-23 DIAGNOSIS — E559 Vitamin D deficiency, unspecified: Secondary | ICD-10-CM | POA: Diagnosis not present

## 2020-01-23 DIAGNOSIS — I10 Essential (primary) hypertension: Secondary | ICD-10-CM | POA: Diagnosis not present

## 2020-01-23 DIAGNOSIS — E7800 Pure hypercholesterolemia, unspecified: Secondary | ICD-10-CM | POA: Diagnosis not present

## 2020-01-23 DIAGNOSIS — Z23 Encounter for immunization: Secondary | ICD-10-CM | POA: Diagnosis not present

## 2020-01-23 DIAGNOSIS — E039 Hypothyroidism, unspecified: Secondary | ICD-10-CM | POA: Diagnosis not present

## 2020-02-07 ENCOUNTER — Ambulatory Visit: Payer: Medicare PPO | Admitting: Cardiology

## 2020-02-17 DIAGNOSIS — L570 Actinic keratosis: Secondary | ICD-10-CM | POA: Diagnosis not present

## 2020-02-17 DIAGNOSIS — L820 Inflamed seborrheic keratosis: Secondary | ICD-10-CM | POA: Diagnosis not present

## 2020-02-17 DIAGNOSIS — L821 Other seborrheic keratosis: Secondary | ICD-10-CM | POA: Diagnosis not present

## 2020-02-17 DIAGNOSIS — D1801 Hemangioma of skin and subcutaneous tissue: Secondary | ICD-10-CM | POA: Diagnosis not present

## 2020-02-17 DIAGNOSIS — D2271 Melanocytic nevi of right lower limb, including hip: Secondary | ICD-10-CM | POA: Diagnosis not present

## 2020-02-19 ENCOUNTER — Other Ambulatory Visit: Payer: Self-pay | Admitting: Cardiology

## 2020-03-06 DIAGNOSIS — M47816 Spondylosis without myelopathy or radiculopathy, lumbar region: Secondary | ICD-10-CM | POA: Diagnosis not present

## 2020-03-06 DIAGNOSIS — M4316 Spondylolisthesis, lumbar region: Secondary | ICD-10-CM | POA: Diagnosis not present

## 2020-03-09 DIAGNOSIS — N3000 Acute cystitis without hematuria: Secondary | ICD-10-CM | POA: Diagnosis not present

## 2020-03-09 DIAGNOSIS — R300 Dysuria: Secondary | ICD-10-CM | POA: Diagnosis not present

## 2020-05-23 DIAGNOSIS — M461 Sacroiliitis, not elsewhere classified: Secondary | ICD-10-CM | POA: Diagnosis not present

## 2020-05-23 DIAGNOSIS — M47816 Spondylosis without myelopathy or radiculopathy, lumbar region: Secondary | ICD-10-CM | POA: Diagnosis not present

## 2020-05-23 DIAGNOSIS — M5136 Other intervertebral disc degeneration, lumbar region: Secondary | ICD-10-CM | POA: Diagnosis not present

## 2020-06-07 DIAGNOSIS — R399 Unspecified symptoms and signs involving the genitourinary system: Secondary | ICD-10-CM | POA: Diagnosis not present

## 2020-06-07 DIAGNOSIS — M5410 Radiculopathy, site unspecified: Secondary | ICD-10-CM | POA: Diagnosis not present

## 2020-06-07 DIAGNOSIS — R319 Hematuria, unspecified: Secondary | ICD-10-CM | POA: Diagnosis not present

## 2020-06-07 DIAGNOSIS — K5900 Constipation, unspecified: Secondary | ICD-10-CM | POA: Diagnosis not present

## 2020-07-05 ENCOUNTER — Telehealth: Payer: Self-pay | Admitting: *Deleted

## 2020-07-05 ENCOUNTER — Other Ambulatory Visit: Payer: Self-pay | Admitting: Cardiology

## 2020-07-05 NOTE — Telephone Encounter (Signed)
Left message for pt to c/b to discuss current dose of Amlodipine.

## 2020-07-05 NOTE — Telephone Encounter (Signed)
Follow Up:      Pt is returning Pam's call from today. 

## 2020-07-05 NOTE — Telephone Encounter (Signed)
Spoke with pt - she does not use [facility] any longer and Dr Maurice Small is now refilling her medications as necessary.

## 2020-07-05 NOTE — Telephone Encounter (Signed)
Spoke with pt - Dr Maurice Small is filling medications now and she does not use [facility] any longer.  Rx refused for these reasons

## 2020-07-05 NOTE — Telephone Encounter (Signed)
Called to speak with pt regarding her current Amlodipine dose.  Our documentation demonstrates pt is to be taking 5 mg 1/2 tablet daily.  Pharmacy is requesting 5 mg to take 1 tablet daily.  Per husband Leonette Most, pt is taking a nap and he will have her call back to discuss once she wakes up.

## 2020-07-09 DIAGNOSIS — M461 Sacroiliitis, not elsewhere classified: Secondary | ICD-10-CM | POA: Diagnosis not present

## 2020-07-23 DIAGNOSIS — E7800 Pure hypercholesterolemia, unspecified: Secondary | ICD-10-CM | POA: Diagnosis not present

## 2020-07-23 DIAGNOSIS — I10 Essential (primary) hypertension: Secondary | ICD-10-CM | POA: Diagnosis not present

## 2020-07-23 DIAGNOSIS — E039 Hypothyroidism, unspecified: Secondary | ICD-10-CM | POA: Diagnosis not present

## 2020-07-23 DIAGNOSIS — K5900 Constipation, unspecified: Secondary | ICD-10-CM | POA: Diagnosis not present

## 2020-08-08 DIAGNOSIS — M461 Sacroiliitis, not elsewhere classified: Secondary | ICD-10-CM | POA: Diagnosis not present

## 2020-08-08 DIAGNOSIS — M5136 Other intervertebral disc degeneration, lumbar region: Secondary | ICD-10-CM | POA: Diagnosis not present

## 2020-09-06 DIAGNOSIS — R300 Dysuria: Secondary | ICD-10-CM | POA: Diagnosis not present

## 2020-09-11 DIAGNOSIS — H524 Presbyopia: Secondary | ICD-10-CM | POA: Diagnosis not present

## 2020-10-11 DIAGNOSIS — R351 Nocturia: Secondary | ICD-10-CM | POA: Diagnosis not present

## 2020-10-11 DIAGNOSIS — R350 Frequency of micturition: Secondary | ICD-10-CM | POA: Diagnosis not present

## 2020-10-11 DIAGNOSIS — N3020 Other chronic cystitis without hematuria: Secondary | ICD-10-CM | POA: Diagnosis not present

## 2020-11-08 DIAGNOSIS — M461 Sacroiliitis, not elsewhere classified: Secondary | ICD-10-CM | POA: Diagnosis not present

## 2020-12-04 DIAGNOSIS — M47816 Spondylosis without myelopathy or radiculopathy, lumbar region: Secondary | ICD-10-CM | POA: Diagnosis not present

## 2020-12-04 DIAGNOSIS — M4155 Other secondary scoliosis, thoracolumbar region: Secondary | ICD-10-CM | POA: Diagnosis not present

## 2020-12-04 DIAGNOSIS — M4802 Spinal stenosis, cervical region: Secondary | ICD-10-CM | POA: Diagnosis not present

## 2020-12-04 DIAGNOSIS — M5136 Other intervertebral disc degeneration, lumbar region: Secondary | ICD-10-CM | POA: Diagnosis not present

## 2020-12-07 DIAGNOSIS — Z1231 Encounter for screening mammogram for malignant neoplasm of breast: Secondary | ICD-10-CM | POA: Diagnosis not present

## 2020-12-18 DIAGNOSIS — M4802 Spinal stenosis, cervical region: Secondary | ICD-10-CM | POA: Diagnosis not present

## 2020-12-18 DIAGNOSIS — M542 Cervicalgia: Secondary | ICD-10-CM | POA: Diagnosis not present

## 2020-12-18 DIAGNOSIS — I10 Essential (primary) hypertension: Secondary | ICD-10-CM | POA: Diagnosis not present

## 2021-01-04 DIAGNOSIS — J338 Other polyp of sinus: Secondary | ICD-10-CM | POA: Diagnosis not present

## 2021-01-04 DIAGNOSIS — J310 Chronic rhinitis: Secondary | ICD-10-CM | POA: Diagnosis not present

## 2021-01-04 DIAGNOSIS — J343 Hypertrophy of nasal turbinates: Secondary | ICD-10-CM | POA: Diagnosis not present

## 2021-01-04 DIAGNOSIS — R0982 Postnasal drip: Secondary | ICD-10-CM | POA: Diagnosis not present

## 2021-01-15 DIAGNOSIS — M549 Dorsalgia, unspecified: Secondary | ICD-10-CM | POA: Diagnosis not present

## 2021-01-31 DIAGNOSIS — E039 Hypothyroidism, unspecified: Secondary | ICD-10-CM | POA: Diagnosis not present

## 2021-01-31 DIAGNOSIS — Z0000 Encounter for general adult medical examination without abnormal findings: Secondary | ICD-10-CM | POA: Diagnosis not present

## 2021-01-31 DIAGNOSIS — E559 Vitamin D deficiency, unspecified: Secondary | ICD-10-CM | POA: Diagnosis not present

## 2021-01-31 DIAGNOSIS — I10 Essential (primary) hypertension: Secondary | ICD-10-CM | POA: Diagnosis not present

## 2021-01-31 DIAGNOSIS — E7800 Pure hypercholesterolemia, unspecified: Secondary | ICD-10-CM | POA: Diagnosis not present

## 2021-02-11 DIAGNOSIS — R350 Frequency of micturition: Secondary | ICD-10-CM | POA: Diagnosis not present

## 2021-02-14 DIAGNOSIS — J310 Chronic rhinitis: Secondary | ICD-10-CM | POA: Diagnosis not present

## 2021-02-14 DIAGNOSIS — J343 Hypertrophy of nasal turbinates: Secondary | ICD-10-CM | POA: Diagnosis not present

## 2021-02-14 DIAGNOSIS — R0982 Postnasal drip: Secondary | ICD-10-CM | POA: Diagnosis not present

## 2021-02-18 ENCOUNTER — Other Ambulatory Visit (HOSPITAL_BASED_OUTPATIENT_CLINIC_OR_DEPARTMENT_OTHER): Payer: Self-pay

## 2021-02-20 ENCOUNTER — Other Ambulatory Visit: Payer: Self-pay

## 2021-02-20 ENCOUNTER — Ambulatory Visit (INDEPENDENT_AMBULATORY_CARE_PROVIDER_SITE_OTHER): Payer: Medicare Other | Admitting: Cardiology

## 2021-02-20 DIAGNOSIS — E7800 Pure hypercholesterolemia, unspecified: Secondary | ICD-10-CM | POA: Diagnosis not present

## 2021-02-20 DIAGNOSIS — R002 Palpitations: Secondary | ICD-10-CM

## 2021-02-20 DIAGNOSIS — D1801 Hemangioma of skin and subcutaneous tissue: Secondary | ICD-10-CM | POA: Diagnosis not present

## 2021-02-20 DIAGNOSIS — D2271 Melanocytic nevi of right lower limb, including hip: Secondary | ICD-10-CM | POA: Diagnosis not present

## 2021-02-20 DIAGNOSIS — L821 Other seborrheic keratosis: Secondary | ICD-10-CM | POA: Diagnosis not present

## 2021-02-20 DIAGNOSIS — I10 Essential (primary) hypertension: Secondary | ICD-10-CM | POA: Diagnosis not present

## 2021-02-20 DIAGNOSIS — L718 Other rosacea: Secondary | ICD-10-CM | POA: Diagnosis not present

## 2021-02-20 DIAGNOSIS — I350 Nonrheumatic aortic (valve) stenosis: Secondary | ICD-10-CM

## 2021-02-20 DIAGNOSIS — L820 Inflamed seborrheic keratosis: Secondary | ICD-10-CM | POA: Diagnosis not present

## 2021-02-20 NOTE — Progress Notes (Signed)
Cardiology Office Note:    Date:  02/20/2021   ID:  Judy Lindsey, DOB 10-06-1935, MRN [id number]  PCP:  Maurice Small, MD   [facility] Providers Cardiologist:  Donato Schultz, MD     Referring MD: Maurice Small, MD   History of Present Illness:    Judy Lindsey is a 85 y.o. female here for follow-up of hypertension after medication changes.  Dr. Valentina Lucks had stopped her diuretic previously HCTZ 12.5 mg once a day.  I had restarted previously amlodipine 2.5.  We also evaluated heart murmur and discussed palpitations.  Overall she is been feeling quite well.  No chest pain no shortness of breath.  Quite active.  Here with her husband.  No fevers chills nausea vomiting syncope bleeding.  Past Medical History:  Diagnosis Date   Arthritis    Fall 2004   mechanical fall, down stair, broke her leg   Heart murmur    Hemorrhoids    History of urinary tract infection    Hypertension    Hypothyroidism    Urinary incontinence     Past Surgical History:  Procedure Laterality Date   ABDOMINAL HYSTERECTOMY     ANKLE SURGERY     right had screw placed still has    APPENDECTOMY     arm surgery      had excess skin removed from under upper arm area bilat 05/16/2015    CHOLECYSTECTOMY     EYE SURGERY     cataract surgery bilat    HEMORRHOID SURGERY     HERNIA REPAIR     TONSILLECTOMY     TOTAL KNEE ARTHROPLASTY Left 07/10/2015   Procedure: LEFT TOTAL KNEE ARTHROPLASTY;  Surgeon: Durene Romans, MD;  Location: [facility];  Service: Orthopedics;  Laterality: Left;   tummy tuck      20 years ago     Current Medications: Current Meds  Medication Sig   amLODipine (NORVASC) 5 MG tablet Take 0.5 tablets (2.5 mg total) by mouth daily.   benazepril (LOTENSIN) 20 MG tablet TAKE 1 TABLET BY MOUTH EVERY DAY   betamethasone dipropionate (DIPROLENE) 0.05 % cream Apply 1 application topically daily as needed (eczema).    levothyroxine (SYNTHROID) 88 MCG tablet Take 88 mcg by mouth every  morning.   omeprazole (PRILOSEC) 40 MG capsule Take 40 mg by mouth daily as needed.   Probiotic Product (ALIGN PO) Take 1 tablet by mouth daily.   rosuvastatin (CRESTOR) 10 MG tablet Take 10 mg by mouth daily.   traMADol (ULTRAM) 50 MG tablet Take 50 mg by mouth every 6 (six) hours as needed for moderate pain.     Allergies:   Codeine and Nubain [nalbuphine]   Social History   Socioeconomic History   Marital status: Married    Spouse name: Not on file   Number of children: Not on file   Years of education: Not on file   Highest education level: Not on file  Occupational History   Not on file  Tobacco Use   Smoking status: Never   Smokeless tobacco: Never  Substance and Sexual Activity   Alcohol use: Yes    Comment: occas glass of wine    Drug use: No   Sexual activity: Not on file  Other Topics Concern   Not on file  Social History Narrative   Not on file   Social Determinants of Health   Financial Resource Strain: Not on file  Food Insecurity: Not on file  Transportation  Needs: Not on file  Physical Activity: Not on file  Stress: Not on file  Social Connections: Not on file     Family History: The patient's family history includes CAD in her father; Parkinson's disease in her mother.  ROS:   Please see the history of present illness.     All other systems reviewed and are negative.  EKGs/Labs/Other Studies Reviewed:    The following studies were reviewed today: ECHO 01/20/20:   1. Left ventricular ejection fraction, by estimation, is 60 to 65%. The  left ventricle has normal function. The left ventricle has no regional  wall motion abnormalities. Left ventricular diastolic parameters are  consistent with Grade I diastolic  dysfunction (impaired relaxation).   2. Right ventricular systolic function is normal. The right ventricular  size is normal. There is mildly elevated pulmonary artery systolic  pressure.   3. The mitral valve is normal in structure.  Trivial mitral valve  regurgitation. No evidence of mitral stenosis.   4. Tricuspid valve regurgitation is mild to moderate.   5. The aortic valve is tricuspid. There is mild calcification of the  aortic valve. There is mild thickening of the aortic valve. Aortic valve  regurgitation is not visualized. Mild aortic valve stenosis. Aortic valve  mean gradient measures 11.0 mmHg.  Aortic valve Vmax measures 2.41 m/s.   6. The inferior vena cava is normal in size with greater than 50%  respiratory variability, suggesting right atrial pressure of 3 mmHg.   EKG: 02/20/2021 sinus rhythm 63 no other changes, left axis deviation.  Prior sinus rhythm 61 with nonspecific ST-T wave changes no change from prior  Recent Labs: No results found for requested labs within last 8760 hours.  Recent Lipid Panel No results found for: CHOL, TRIG, HDL, CHOLHDL, VLDL, LDLCALC, LDLDIRECT    Physical Exam:    VS:  BP 130/70 (BP Location: Left Arm, Patient Position: Sitting, Cuff Size: Normal)   Pulse 63   Ht 5\' 3"  (1.6 m)   Wt 141 lb (64 kg)   BMI 24.98 kg/m     Wt Readings from Last 3 Encounters:  02/20/21 141 lb (64 kg)  01/04/20 142 lb 12.8 oz (64.8 kg)  07/20/19 145 lb (65.8 kg)     GEN:  Well nourished, well developed in no acute distress HEENT: Normal NECK: No JVD; No carotid bruits LYMPHATICS: No lymphadenopathy CARDIAC: RRR, 2/6 SM, no rubs, gallops RESPIRATORY:  Clear to auscultation without rales, wheezing or rhonchi  ABDOMEN: Soft, non-tender, non-distended MUSCULOSKELETAL:  No edema; No deformity  SKIN: Warm and dry NEUROLOGIC:  Alert and oriented x 3 PSYCHIATRIC:  Normal affect   ASSESSMENT:    1. Nonrheumatic aortic valve stenosis   2. Essential hypertension   3. Pure hypercholesterolemia   4. Palpitations    PLAN:    In order of problems listed above: Aortic stenosis Echocardiogram 2021-mild aortic stenosis mean gradient 11 mmHg.  Continue to monitor clinically at this  point.  May repeat echocardiogram in the next 3 to 5 years.  Essential hypertension Appreciate Dr. 09/19/19 assistance with this.  Medications reviewed as above.  Continue with current medical management.  Pure hypercholesterolemia Continuing with Crestor 10 mg once a day for medical management.  No myalgias.  Prior LDL 109, HDL 76 triglycerides 123.  Hemoglobin 12.9 creatinine 0.54 potassium 4.2.  Excellent.  Palpitations Stable, doing better, no complaints       Medication Adjustments/Labs and Tests Ordered: Current medicines are reviewed at length  with the patient today.  Concerns regarding medicines are outlined above.  Orders Placed This Encounter  Procedures   EKG 12-Lead    No orders of the defined types were placed in this encounter.   Patient Instructions  Medication Instructions:  The current medical regimen is effective;  continue present plan and medications.  *If you need a refill on your cardiac medications before your next appointment, please call your pharmacy*  Follow-Up: At [facility], you and your health needs are our priority.  As part of our continuing mission to provide you with exceptional heart care, we have created designated Provider Care Teams.  These Care Teams include your primary Cardiologist (physician) and Advanced Practice Providers (APPs -  Physician Assistants and Nurse Practitioners) who all work together to provide you with the care you need, when you need it.  We recommend signing up for the patient portal called "MyChart".  Sign up information is provided on this After Visit Summary.  MyChart is used to connect with patients for Virtual Visits (Telemedicine).  Patients are able to view lab/test results, encounter notes, upcoming appointments, etc.  Non-urgent messages can be sent to your provider as well.   To learn more about what you can do with MyChart, go to [email or web address].    Your next appointment:   1 year(s)  The  format for your next appointment:   In Person  Provider:   Donato Schultz, MD   Thank you for choosing [facility]!!     Signed, Donato Schultz, MD  02/20/2021 9:27 AM    [facility]

## 2021-02-20 NOTE — Assessment & Plan Note (Signed)
Stable, doing better, no complaints

## 2021-02-20 NOTE — Assessment & Plan Note (Addendum)
Continuing with Crestor 10 mg once a day for medical management.  No myalgias.  Prior LDL 109, HDL 76 triglycerides 123.  Hemoglobin 12.9 creatinine 0.54 potassium 4.2.  Excellent.

## 2021-02-20 NOTE — Assessment & Plan Note (Signed)
Appreciate Dr. Jone Baseman assistance with this.  Medications reviewed as above.  Continue with current medical management.

## 2021-02-20 NOTE — Patient Instructions (Signed)
Medication Instructions:  The current medical regimen is effective;  continue present plan and medications.  *If you need a refill on your cardiac medications before your next appointment, please call your pharmacy*  Follow-Up: At [facility], you and your health needs are our priority.  As part of our continuing mission to provide you with exceptional heart care, we have created designated Provider Care Teams.  These Care Teams include your primary Cardiologist (physician) and Advanced Practice Providers (APPs -  Physician Assistants and Nurse Practitioners) who all work together to provide you with the care you need, when you need it.  We recommend signing up for the patient portal called "MyChart".  Sign up information is provided on this After Visit Summary.  MyChart is used to connect with patients for Virtual Visits (Telemedicine).  Patients are able to view lab/test results, encounter notes, upcoming appointments, etc.  Non-urgent messages can be sent to your provider as well.   To learn more about what you can do with MyChart, go to [email or web address].    Your next appointment:   1 year(s)  The format for your next appointment:   In Person  Provider:   Mark Skains, MD   Thank you for choosing [facility]!!    

## 2021-02-20 NOTE — Assessment & Plan Note (Signed)
Echocardiogram 2021-mild aortic stenosis mean gradient 11 mmHg.  Continue to monitor clinically at this point.  May repeat echocardiogram in the next 3 to 5 years.

## 2021-03-11 ENCOUNTER — Ambulatory Visit: Payer: BC Managed Care – PPO | Attending: Internal Medicine

## 2021-03-11 ENCOUNTER — Other Ambulatory Visit (HOSPITAL_BASED_OUTPATIENT_CLINIC_OR_DEPARTMENT_OTHER): Payer: Self-pay

## 2021-03-11 DIAGNOSIS — Z23 Encounter for immunization: Secondary | ICD-10-CM

## 2021-03-11 MED ORDER — MODERNA COVID-19 BIVAL BOOSTER 50 MCG/0.5ML IM SUSP
INTRAMUSCULAR | 0 refills | Status: DC
  Filled 2021-03-11: qty 0.5, 1d supply, fill #0

## 2021-03-11 NOTE — Progress Notes (Signed)
   Covid-19 Vaccination Clinic  Name:  Judy Lindsey    MRN: [id number] DOB: January 03, 1936  03/11/2021  [name] was observed post Covid-19 immunization for 15 minutes15 minutes without incident. She was provided with Vaccine Information Sheet and instruction to access the V-Safe system.   [name] was instructed to call 911 with any severe reactions post vaccine: Difficulty breathing  Swelling of face and throat  A fast heartbeat  A bad rash all over body  Dizziness and weakness   Immunizations Administered     Name Date Dose VIS Date Route   Moderna Covid-19 vaccine Bivalent Booster 03/11/2021  1:06 PM 0.5 mL 12/22/2020 Intramuscular   Manufacturer: Moderna   Lot: [id number]   NDC: 82993-716-96

## 2021-03-19 DIAGNOSIS — M542 Cervicalgia: Secondary | ICD-10-CM | POA: Diagnosis not present

## 2021-03-19 DIAGNOSIS — M461 Sacroiliitis, not elsewhere classified: Secondary | ICD-10-CM | POA: Diagnosis not present

## 2021-03-19 DIAGNOSIS — M47816 Spondylosis without myelopathy or radiculopathy, lumbar region: Secondary | ICD-10-CM | POA: Diagnosis not present

## 2021-04-11 DIAGNOSIS — M545 Low back pain, unspecified: Secondary | ICD-10-CM | POA: Diagnosis not present

## 2021-05-30 DIAGNOSIS — E559 Vitamin D deficiency, unspecified: Secondary | ICD-10-CM | POA: Diagnosis not present

## 2021-06-17 DIAGNOSIS — M542 Cervicalgia: Secondary | ICD-10-CM | POA: Diagnosis not present

## 2021-06-17 DIAGNOSIS — M461 Sacroiliitis, not elsewhere classified: Secondary | ICD-10-CM | POA: Diagnosis not present

## 2021-06-17 DIAGNOSIS — M47816 Spondylosis without myelopathy or radiculopathy, lumbar region: Secondary | ICD-10-CM | POA: Diagnosis not present

## 2021-07-01 DIAGNOSIS — R309 Painful micturition, unspecified: Secondary | ICD-10-CM | POA: Diagnosis not present

## 2021-07-01 DIAGNOSIS — N3010 Interstitial cystitis (chronic) without hematuria: Secondary | ICD-10-CM | POA: Diagnosis not present

## 2021-07-26 DIAGNOSIS — R350 Frequency of micturition: Secondary | ICD-10-CM | POA: Diagnosis not present

## 2021-07-26 DIAGNOSIS — N390 Urinary tract infection, site not specified: Secondary | ICD-10-CM | POA: Diagnosis not present

## 2021-07-26 DIAGNOSIS — I10 Essential (primary) hypertension: Secondary | ICD-10-CM | POA: Diagnosis not present

## 2021-07-26 DIAGNOSIS — R197 Diarrhea, unspecified: Secondary | ICD-10-CM | POA: Diagnosis not present

## 2021-07-26 DIAGNOSIS — N3010 Interstitial cystitis (chronic) without hematuria: Secondary | ICD-10-CM | POA: Diagnosis not present

## 2021-08-01 DIAGNOSIS — E7800 Pure hypercholesterolemia, unspecified: Secondary | ICD-10-CM | POA: Diagnosis not present

## 2021-08-01 DIAGNOSIS — E039 Hypothyroidism, unspecified: Secondary | ICD-10-CM | POA: Diagnosis not present

## 2021-08-01 DIAGNOSIS — I10 Essential (primary) hypertension: Secondary | ICD-10-CM | POA: Diagnosis not present

## 2021-08-01 DIAGNOSIS — I700 Atherosclerosis of aorta: Secondary | ICD-10-CM | POA: Diagnosis not present

## 2021-08-15 DIAGNOSIS — J310 Chronic rhinitis: Secondary | ICD-10-CM | POA: Diagnosis not present

## 2021-08-15 DIAGNOSIS — R0982 Postnasal drip: Secondary | ICD-10-CM | POA: Diagnosis not present

## 2021-08-15 DIAGNOSIS — J343 Hypertrophy of nasal turbinates: Secondary | ICD-10-CM | POA: Diagnosis not present

## 2021-09-18 DIAGNOSIS — H52203 Unspecified astigmatism, bilateral: Secondary | ICD-10-CM | POA: Diagnosis not present

## 2021-10-03 DIAGNOSIS — M25532 Pain in left wrist: Secondary | ICD-10-CM | POA: Diagnosis not present

## 2021-10-03 DIAGNOSIS — G5600 Carpal tunnel syndrome, unspecified upper limb: Secondary | ICD-10-CM | POA: Diagnosis not present

## 2021-10-29 DIAGNOSIS — R399 Unspecified symptoms and signs involving the genitourinary system: Secondary | ICD-10-CM | POA: Diagnosis not present

## 2021-10-29 DIAGNOSIS — E559 Vitamin D deficiency, unspecified: Secondary | ICD-10-CM | POA: Diagnosis not present

## 2021-10-29 DIAGNOSIS — G479 Sleep disorder, unspecified: Secondary | ICD-10-CM | POA: Diagnosis not present

## 2021-11-07 DIAGNOSIS — M180 Bilateral primary osteoarthritis of first carpometacarpal joints: Secondary | ICD-10-CM | POA: Diagnosis not present

## 2021-11-07 DIAGNOSIS — M65312 Trigger thumb, left thumb: Secondary | ICD-10-CM | POA: Diagnosis not present

## 2021-11-07 DIAGNOSIS — G5603 Carpal tunnel syndrome, bilateral upper limbs: Secondary | ICD-10-CM | POA: Diagnosis not present

## 2021-12-17 DIAGNOSIS — Z6825 Body mass index (BMI) 25.0-25.9, adult: Secondary | ICD-10-CM | POA: Diagnosis not present

## 2021-12-17 DIAGNOSIS — M542 Cervicalgia: Secondary | ICD-10-CM | POA: Diagnosis not present

## 2021-12-17 DIAGNOSIS — M47816 Spondylosis without myelopathy or radiculopathy, lumbar region: Secondary | ICD-10-CM | POA: Diagnosis not present

## 2021-12-19 DIAGNOSIS — M65312 Trigger thumb, left thumb: Secondary | ICD-10-CM | POA: Diagnosis not present

## 2021-12-19 DIAGNOSIS — G5603 Carpal tunnel syndrome, bilateral upper limbs: Secondary | ICD-10-CM | POA: Diagnosis not present

## 2021-12-19 DIAGNOSIS — M19049 Primary osteoarthritis, unspecified hand: Secondary | ICD-10-CM | POA: Diagnosis not present

## 2021-12-25 DIAGNOSIS — M542 Cervicalgia: Secondary | ICD-10-CM | POA: Diagnosis not present

## 2021-12-27 DIAGNOSIS — Z1231 Encounter for screening mammogram for malignant neoplasm of breast: Secondary | ICD-10-CM | POA: Diagnosis not present

## 2022-01-16 DIAGNOSIS — G5603 Carpal tunnel syndrome, bilateral upper limbs: Secondary | ICD-10-CM | POA: Diagnosis not present

## 2022-01-16 DIAGNOSIS — M19049 Primary osteoarthritis, unspecified hand: Secondary | ICD-10-CM | POA: Diagnosis not present

## 2022-01-27 DIAGNOSIS — R300 Dysuria: Secondary | ICD-10-CM | POA: Diagnosis not present

## 2022-01-27 DIAGNOSIS — N3010 Interstitial cystitis (chronic) without hematuria: Secondary | ICD-10-CM | POA: Diagnosis not present

## 2022-02-03 DIAGNOSIS — I700 Atherosclerosis of aorta: Secondary | ICD-10-CM | POA: Diagnosis not present

## 2022-02-03 DIAGNOSIS — Z0000 Encounter for general adult medical examination without abnormal findings: Secondary | ICD-10-CM | POA: Diagnosis not present

## 2022-02-03 DIAGNOSIS — I10 Essential (primary) hypertension: Secondary | ICD-10-CM | POA: Diagnosis not present

## 2022-02-03 DIAGNOSIS — E039 Hypothyroidism, unspecified: Secondary | ICD-10-CM | POA: Diagnosis not present

## 2022-02-03 DIAGNOSIS — E7800 Pure hypercholesterolemia, unspecified: Secondary | ICD-10-CM | POA: Diagnosis not present

## 2022-02-12 DIAGNOSIS — G5602 Carpal tunnel syndrome, left upper limb: Secondary | ICD-10-CM | POA: Diagnosis not present

## 2022-02-14 DIAGNOSIS — R0982 Postnasal drip: Secondary | ICD-10-CM | POA: Diagnosis not present

## 2022-02-14 DIAGNOSIS — J343 Hypertrophy of nasal turbinates: Secondary | ICD-10-CM | POA: Diagnosis not present

## 2022-02-14 DIAGNOSIS — J310 Chronic rhinitis: Secondary | ICD-10-CM | POA: Diagnosis not present

## 2022-03-03 DIAGNOSIS — D225 Melanocytic nevi of trunk: Secondary | ICD-10-CM | POA: Diagnosis not present

## 2022-03-03 DIAGNOSIS — R208 Other disturbances of skin sensation: Secondary | ICD-10-CM | POA: Diagnosis not present

## 2022-03-03 DIAGNOSIS — L718 Other rosacea: Secondary | ICD-10-CM | POA: Diagnosis not present

## 2022-03-03 DIAGNOSIS — L820 Inflamed seborrheic keratosis: Secondary | ICD-10-CM | POA: Diagnosis not present

## 2022-06-09 DIAGNOSIS — M47816 Spondylosis without myelopathy or radiculopathy, lumbar region: Secondary | ICD-10-CM | POA: Diagnosis not present

## 2022-06-09 DIAGNOSIS — M542 Cervicalgia: Secondary | ICD-10-CM | POA: Diagnosis not present

## 2022-06-09 DIAGNOSIS — M461 Sacroiliitis, not elsewhere classified: Secondary | ICD-10-CM | POA: Diagnosis not present

## 2022-06-09 DIAGNOSIS — Z6825 Body mass index (BMI) 25.0-25.9, adult: Secondary | ICD-10-CM | POA: Diagnosis not present

## 2022-06-09 DIAGNOSIS — M5416 Radiculopathy, lumbar region: Secondary | ICD-10-CM | POA: Diagnosis not present

## 2022-06-24 ENCOUNTER — Ambulatory Visit: Payer: BC Managed Care – PPO | Attending: Cardiology | Admitting: Cardiology

## 2022-06-24 ENCOUNTER — Encounter: Payer: Self-pay | Admitting: Cardiology

## 2022-06-24 VITALS — BP 138/74 | HR 63 | Ht 62.0 in | Wt 144.8 lb

## 2022-06-24 DIAGNOSIS — I350 Nonrheumatic aortic (valve) stenosis: Secondary | ICD-10-CM

## 2022-06-24 DIAGNOSIS — E7800 Pure hypercholesterolemia, unspecified: Secondary | ICD-10-CM

## 2022-06-24 DIAGNOSIS — I10 Essential (primary) hypertension: Secondary | ICD-10-CM

## 2022-06-24 NOTE — Patient Instructions (Signed)
Medication Instructions:  The current medical regimen is effective;  continue present plan and medications.  *If you need a refill on your cardiac medications before your next appointment, please call your pharmacy*  Testing/Procedures: Your physician has requested that you have an echocardiogram. Echocardiography is a painless test that uses sound waves to create images of your heart. It provides your doctor with information about the size and shape of your heart and how well your heart's chambers and valves are working. This procedure takes approximately one hour. There are no restrictions for this procedure. Please do NOT wear cologne, perfume, aftershave, or lotions (deodorant is allowed). Please arrive 15 minutes prior to your appointment time.    Follow-Up: At [facility], you and your health needs are our priority.  As part of our continuing mission to provide you with exceptional heart care, we have created designated Provider Care Teams.  These Care Teams include your primary Cardiologist (physician) and Advanced Practice Providers (APPs -  Physician Assistants and Nurse Practitioners) who all work together to provide you with the care you need, when you need it.  We recommend signing up for the patient portal called "MyChart".  Sign up information is provided on this After Visit Summary.  MyChart is used to connect with patients for Virtual Visits (Telemedicine).  Patients are able to view lab/test results, encounter notes, upcoming appointments, etc.  Non-urgent messages can be sent to your provider as well.   To learn more about what you can do with MyChart, go to [email or web address].    Your next appointment:   1 year(s)  Provider:   Candee Furbish, MD

## 2022-06-24 NOTE — Progress Notes (Signed)
Cardiology Office Note:    Date:  06/24/2022   ID:  Judy Lindsey, DOB April 23, 1936, MRN [id number]  PCP:  Judy Jewel, MD   [facility] Providers Cardiologist:  Candee Furbish, MD     Referring MD: Kelton Pillar, MD   History of Present Illness:    Judy Lindsey is a 87 y.o. female here for follow-up of hypertension after medication changes.  Dr. Laurann Montana had stopped her diuretic previously HCTZ 12.5 mg once a day. Dr. Deretha Emory is her new primary care physician since Dr. Kelton Pillar has retired.  I had restarted previously amlodipine 2.5.  Overall been doing quite well with her blood pressures.  She has not really felt any heart palpitations no chest pain fevers chills nausea vomiting.  She has a new puppy, Judy Lindsey, who is a black toy poodle.  She has been walking her and does feel some shortness of breath when going up the hills.  Likely deconditioning.     Past Medical History:  Diagnosis Date   Arthritis    Fall 2004   mechanical fall, down stair, broke her leg   Heart murmur    Hemorrhoids    History of urinary tract infection    Hypertension    Hypothyroidism    Urinary incontinence     Past Surgical History:  Procedure Laterality Date   ABDOMINAL HYSTERECTOMY     ANKLE SURGERY     right had screw placed still has    APPENDECTOMY     arm surgery      had excess skin removed from under upper arm area bilat 05/16/2015    CHOLECYSTECTOMY     EYE SURGERY     cataract surgery bilat    HEMORRHOID SURGERY     HERNIA REPAIR     TONSILLECTOMY     TOTAL KNEE ARTHROPLASTY Left 07/10/2015   Procedure: LEFT TOTAL KNEE ARTHROPLASTY;  Surgeon: Paralee Cancel, MD;  Location: [facility];  Service: Orthopedics;  Laterality: Left;   tummy tuck      20 years ago     Current Medications: Current Meds  Medication Sig   amLODipine (NORVASC) 5 MG tablet Take 0.5 tablets (2.5 mg total) by mouth daily.   benazepril (LOTENSIN) 20 MG tablet TAKE 1 TABLET BY MOUTH EVERY DAY    betamethasone dipropionate (DIPROLENE) 0.05 % cream Apply 1 application topically daily as needed (eczema).    gabapentin (NEURONTIN) 100 MG capsule Take 100 mg by mouth at bedtime.   levothyroxine (SYNTHROID) 88 MCG tablet Take 88 mcg by mouth every morning.   omeprazole (PRILOSEC) 40 MG capsule Take 40 mg by mouth daily as needed.   Probiotic Product (ALIGN PO) Take 1 tablet by mouth daily.   traMADol (ULTRAM) 50 MG tablet Take 50 mg by mouth every 6 (six) hours as needed for moderate pain.     Allergies:   Codeine, Nubain [nalbuphine], Atorvastatin, and Rosuvastatin   Social History   Socioeconomic History   Marital status: Married    Spouse name: Not on file   Number of children: Not on file   Years of education: Not on file   Highest education level: Not on file  Occupational History   Not on file  Tobacco Use   Smoking status: Never   Smokeless tobacco: Never  Substance and Sexual Activity   Alcohol use: Yes    Comment: occas glass of wine    Drug use: No   Sexual activity: Not on file  Other Topics Concern   Not on file  Social History Narrative   Not on file   Social Determinants of Health   Financial Resource Strain: Not on file  Food Insecurity: Not on file  Transportation Needs: Not on file  Physical Activity: Not on file  Stress: Not on file  Social Connections: Not on file     Family History: The patient's family history includes CAD in her father; Parkinson's disease in her mother.  ROS:   Please see the history of present illness.     All other systems reviewed and are negative.  EKGs/Labs/Other Studies Reviewed:    The following studies were reviewed today: ECHO 01/20/20:   1. Left ventricular ejection fraction, by estimation, is 60 to 65%. The  left ventricle has normal function. The left ventricle has no regional  wall motion abnormalities. Left ventricular diastolic parameters are  consistent with Grade I diastolic  dysfunction (impaired  relaxation).   2. Right ventricular systolic function is normal. The right ventricular  size is normal. There is mildly elevated pulmonary artery systolic  pressure.   3. The mitral valve is normal in structure. Trivial mitral valve  regurgitation. No evidence of mitral stenosis.   4. Tricuspid valve regurgitation is mild to moderate.   5. The aortic valve is tricuspid. There is mild calcification of the  aortic valve. There is mild thickening of the aortic valve. Aortic valve  regurgitation is not visualized. Mild aortic valve stenosis. Aortic valve  mean gradient measures 11.0 mmHg.  Aortic valve Vmax measures 2.41 m/s.   6. The inferior vena cava is normal in size with greater than 50%  respiratory variability, suggesting right atrial pressure of 3 mmHg.   EKG:  06/24/2022: Sinus rhythm 63 PVC noted nonspecific T wave change 02/20/2021 sinus rhythm 63 no other changes, left axis deviation.  Prior sinus rhythm 61 with nonspecific ST-T wave changes no change from prior  Recent Labs: No results found for requested labs within last 365 days.  Recent Lipid Panel No results found for: "CHOL", "TRIG", "HDL", "CHOLHDL", "VLDL", "LDLCALC", "LDLDIRECT"    Physical Exam:    VS:  BP 138/74   Pulse 63   Ht 5' 2"$  (1.575 m)   Wt 144 lb 12.8 oz (65.7 kg)   SpO2 96%   BMI 26.48 kg/m     Wt Readings from Last 3 Encounters:  06/24/22 144 lb 12.8 oz (65.7 kg)  02/20/21 141 lb (64 kg)  01/04/20 142 lb 12.8 oz (64.8 kg)     GEN:  Well nourished, well developed in no acute distress HEENT: Normal NECK: No JVD; No carotid bruits LYMPHATICS: No lymphadenopathy CARDIAC: RRR, 2/6 SM, no rubs, gallops, no changes RESPIRATORY:  Clear to auscultation without rales, wheezing or rhonchi  ABDOMEN: Soft, non-tender, non-distended MUSCULOSKELETAL:  No edema; No deformity  SKIN: Warm and dry NEUROLOGIC:  Alert and oriented x 3 PSYCHIATRIC:  Normal affect   ASSESSMENT:    1. Nonrheumatic aortic  valve stenosis   2. Essential hypertension   3. Pure hypercholesterolemia     PLAN:    In order of problems listed above:   Aortic stenosis Echocardiogram 2021-mild aortic stenosis mean gradient 11 mmHg.  We will check an echocardiogram.  We discussed long-term possibilities.   Essential hypertension Appreciate Dr. Deretha Emory assistance with this.  Medications reviewed as above.  Continue with current medical management.  No changes made today.   Pure hypercholesterolemia Previously she had taken Crestor 10 mg  once a day for medical management.  Prior LDL 109, HDL 76 triglycerides 123.  Most recent LDL was 191.  Would encourage Crestor use if possible.  Hemoglobin 13.2 creatinine 0.53 potassium 4.2.     Palpitations Stable, doing better, no complaints      Medication Adjustments/Labs and Tests Ordered: Current medicines are reviewed at length with the patient today.  Concerns regarding medicines are outlined above.  Orders Placed This Encounter  Procedures   EKG 12-Lead   ECHOCARDIOGRAM COMPLETE    No orders of the defined types were placed in this encounter.    Patient Instructions  Medication Instructions:  The current medical regimen is effective;  continue present plan and medications.  *If you need a refill on your cardiac medications before your next appointment, please call your pharmacy*  Testing/Procedures: Your physician has requested that you have an echocardiogram. Echocardiography is a painless test that uses sound waves to create images of your heart. It provides your doctor with information about the size and shape of your heart and how well your heart's chambers and valves are working. This procedure takes approximately one hour. There are no restrictions for this procedure. Please do NOT wear cologne, perfume, aftershave, or lotions (deodorant is allowed). Please arrive 15 minutes prior to your appointment time.    Follow-Up: At [facility],  you and your health needs are our priority.  As part of our continuing mission to provide you with exceptional heart care, we have created designated Provider Care Teams.  These Care Teams include your primary Cardiologist (physician) and Advanced Practice Providers (APPs -  Physician Assistants and Nurse Practitioners) who all work together to provide you with the care you need, when you need it.  We recommend signing up for the patient portal called "MyChart".  Sign up information is provided on this After Visit Summary.  MyChart is used to connect with patients for Virtual Visits (Telemedicine).  Patients are able to view lab/test results, encounter notes, upcoming appointments, etc.  Non-urgent messages can be sent to your provider as well.   To learn more about what you can do with MyChart, go to [email or web address].    Your next appointment:   1 year(s)  Provider:   Candee Furbish, MD        Signed, Candee Furbish, MD  06/24/2022 10:52 AM    [place]

## 2022-06-27 ENCOUNTER — Ambulatory Visit (HOSPITAL_COMMUNITY): Payer: Medicare Other | Attending: Cardiology

## 2022-06-27 DIAGNOSIS — I350 Nonrheumatic aortic (valve) stenosis: Secondary | ICD-10-CM | POA: Diagnosis not present

## 2022-06-27 DIAGNOSIS — I10 Essential (primary) hypertension: Secondary | ICD-10-CM | POA: Diagnosis not present

## 2022-06-27 LAB — ECHOCARDIOGRAM COMPLETE
AR max vel: 1.17 cm2
AV Area VTI: 1.2 cm2
AV Area mean vel: 1.14 cm2
AV Mean grad: 16 mmHg
AV Peak grad: 27.3 mmHg
Ao pk vel: 2.61 m/s
Area-P 1/2: 3.21 cm2
S' Lateral: 2.3 cm

## 2022-07-16 DIAGNOSIS — K080 Exfoliation of teeth due to systemic causes: Secondary | ICD-10-CM | POA: Diagnosis not present

## 2022-09-12 DIAGNOSIS — H04123 Dry eye syndrome of bilateral lacrimal glands: Secondary | ICD-10-CM | POA: Diagnosis not present

## 2022-09-12 DIAGNOSIS — Z961 Presence of intraocular lens: Secondary | ICD-10-CM | POA: Diagnosis not present

## 2022-10-07 DIAGNOSIS — R519 Headache, unspecified: Secondary | ICD-10-CM | POA: Diagnosis not present

## 2022-10-07 DIAGNOSIS — Z0000 Encounter for general adult medical examination without abnormal findings: Secondary | ICD-10-CM | POA: Diagnosis not present

## 2022-10-07 DIAGNOSIS — U071 COVID-19: Secondary | ICD-10-CM | POA: Diagnosis not present

## 2022-10-07 DIAGNOSIS — R399 Unspecified symptoms and signs involving the genitourinary system: Secondary | ICD-10-CM | POA: Diagnosis not present

## 2022-12-12 DIAGNOSIS — M542 Cervicalgia: Secondary | ICD-10-CM | POA: Diagnosis not present

## 2022-12-12 DIAGNOSIS — M47816 Spondylosis without myelopathy or radiculopathy, lumbar region: Secondary | ICD-10-CM | POA: Diagnosis not present

## 2022-12-29 DIAGNOSIS — Z1231 Encounter for screening mammogram for malignant neoplasm of breast: Secondary | ICD-10-CM | POA: Diagnosis not present

## 2023-01-26 DIAGNOSIS — K080 Exfoliation of teeth due to systemic causes: Secondary | ICD-10-CM | POA: Diagnosis not present

## 2023-02-05 DIAGNOSIS — M810 Age-related osteoporosis without current pathological fracture: Secondary | ICD-10-CM | POA: Diagnosis not present

## 2023-02-05 DIAGNOSIS — I700 Atherosclerosis of aorta: Secondary | ICD-10-CM | POA: Diagnosis not present

## 2023-02-05 DIAGNOSIS — Z23 Encounter for immunization: Secondary | ICD-10-CM | POA: Diagnosis not present

## 2023-02-05 DIAGNOSIS — Z0000 Encounter for general adult medical examination without abnormal findings: Secondary | ICD-10-CM | POA: Diagnosis not present

## 2023-02-05 DIAGNOSIS — E7800 Pure hypercholesterolemia, unspecified: Secondary | ICD-10-CM | POA: Diagnosis not present

## 2023-02-05 DIAGNOSIS — I10 Essential (primary) hypertension: Secondary | ICD-10-CM | POA: Diagnosis not present

## 2023-02-05 DIAGNOSIS — R350 Frequency of micturition: Secondary | ICD-10-CM | POA: Diagnosis not present

## 2023-02-05 DIAGNOSIS — E039 Hypothyroidism, unspecified: Secondary | ICD-10-CM | POA: Diagnosis not present

## 2023-03-05 DIAGNOSIS — L820 Inflamed seborrheic keratosis: Secondary | ICD-10-CM | POA: Diagnosis not present

## 2023-03-05 DIAGNOSIS — L821 Other seborrheic keratosis: Secondary | ICD-10-CM | POA: Diagnosis not present

## 2023-03-05 DIAGNOSIS — L814 Other melanin hyperpigmentation: Secondary | ICD-10-CM | POA: Diagnosis not present

## 2023-03-05 DIAGNOSIS — R208 Other disturbances of skin sensation: Secondary | ICD-10-CM | POA: Diagnosis not present

## 2023-03-05 DIAGNOSIS — L218 Other seborrheic dermatitis: Secondary | ICD-10-CM | POA: Diagnosis not present

## 2023-04-29 ENCOUNTER — Emergency Department (HOSPITAL_BASED_OUTPATIENT_CLINIC_OR_DEPARTMENT_OTHER)
Admission: EM | Admit: 2023-04-29 | Discharge: 2023-04-29 | Disposition: A | Discharge status: Home / Self Care | Payer: Medicare Other | Attending: Emergency Medicine | Admitting: Emergency Medicine

## 2023-04-29 ENCOUNTER — Emergency Department (HOSPITAL_BASED_OUTPATIENT_CLINIC_OR_DEPARTMENT_OTHER): Payer: Medicare Other

## 2023-04-29 ENCOUNTER — Encounter (HOSPITAL_BASED_OUTPATIENT_CLINIC_OR_DEPARTMENT_OTHER): Payer: Self-pay

## 2023-04-29 ENCOUNTER — Emergency Department (HOSPITAL_BASED_OUTPATIENT_CLINIC_OR_DEPARTMENT_OTHER): Payer: Medicare Other | Admitting: Radiology

## 2023-04-29 ENCOUNTER — Other Ambulatory Visit: Payer: Self-pay

## 2023-04-29 DIAGNOSIS — J9811 Atelectasis: Secondary | ICD-10-CM | POA: Diagnosis not present

## 2023-04-29 DIAGNOSIS — N3000 Acute cystitis without hematuria: Secondary | ICD-10-CM | POA: Diagnosis not present

## 2023-04-29 DIAGNOSIS — R109 Unspecified abdominal pain: Secondary | ICD-10-CM | POA: Insufficient documentation

## 2023-04-29 DIAGNOSIS — I10 Essential (primary) hypertension: Secondary | ICD-10-CM | POA: Insufficient documentation

## 2023-04-29 DIAGNOSIS — R918 Other nonspecific abnormal finding of lung field: Secondary | ICD-10-CM | POA: Diagnosis not present

## 2023-04-29 DIAGNOSIS — N281 Cyst of kidney, acquired: Secondary | ICD-10-CM | POA: Diagnosis not present

## 2023-04-29 DIAGNOSIS — R1031 Right lower quadrant pain: Secondary | ICD-10-CM | POA: Diagnosis not present

## 2023-04-29 DIAGNOSIS — R079 Chest pain, unspecified: Secondary | ICD-10-CM | POA: Diagnosis not present

## 2023-04-29 DIAGNOSIS — Z79899 Other long term (current) drug therapy: Secondary | ICD-10-CM | POA: Insufficient documentation

## 2023-04-29 DIAGNOSIS — R9389 Abnormal findings on diagnostic imaging of other specified body structures: Secondary | ICD-10-CM | POA: Diagnosis not present

## 2023-04-29 LAB — URINALYSIS, ROUTINE W REFLEX MICROSCOPIC
Bilirubin Urine: NEGATIVE
Glucose, UA: NEGATIVE mg/dL
Ketones, ur: >80 mg/dL — AB
Nitrite: NEGATIVE
Specific Gravity, Urine: 1.013 (ref 1.005–1.030)
pH: 5.5 (ref 5.0–8.0)

## 2023-04-29 LAB — CBC WITH DIFFERENTIAL/PLATELET
Abs Immature Granulocytes: 0.15 10*3/uL — ABNORMAL HIGH (ref 0.00–0.07)
Basophils Absolute: 0 10*3/uL (ref 0.0–0.1)
Basophils Relative: 0 %
Eosinophils Absolute: 0 10*3/uL (ref 0.0–0.5)
Eosinophils Relative: 0 %
HCT: 33.4 % — ABNORMAL LOW (ref 36.0–46.0)
Hemoglobin: 11.2 g/dL — ABNORMAL LOW (ref 12.0–15.0)
Immature Granulocytes: 1 %
Lymphocytes Relative: 9 %
Lymphs Abs: 1.6 10*3/uL (ref 0.7–4.0)
MCH: 30.1 pg (ref 26.0–34.0)
MCHC: 33.5 g/dL (ref 30.0–36.0)
MCV: 89.8 fL (ref 80.0–100.0)
Monocytes Absolute: 1.2 10*3/uL — ABNORMAL HIGH (ref 0.1–1.0)
Monocytes Relative: 7 %
Neutro Abs: 14.3 10*3/uL — ABNORMAL HIGH (ref 1.7–7.7)
Neutrophils Relative %: 83 %
Platelets: 233 10*3/uL (ref 150–400)
RBC: 3.72 MIL/uL — ABNORMAL LOW (ref 3.87–5.11)
RDW: 12.4 % (ref 11.5–15.5)
WBC: 17.3 10*3/uL — ABNORMAL HIGH (ref 4.0–10.5)
nRBC: 0 % (ref 0.0–0.2)

## 2023-04-29 LAB — COMPREHENSIVE METABOLIC PANEL
ALT: 11 U/L (ref 0–44)
AST: 11 U/L — ABNORMAL LOW (ref 15–41)
Albumin: 3.7 g/dL (ref 3.5–5.0)
Alkaline Phosphatase: 75 U/L (ref 38–126)
Anion gap: 10 (ref 5–15)
BUN: 6 mg/dL — ABNORMAL LOW (ref 8–23)
CO2: 24 mmol/L (ref 22–32)
Calcium: 8.7 mg/dL — ABNORMAL LOW (ref 8.9–10.3)
Chloride: 97 mmol/L — ABNORMAL LOW (ref 98–111)
Creatinine, Ser: 0.41 mg/dL — ABNORMAL LOW (ref 0.44–1.00)
GFR, Estimated: >60 mL/min (ref >60)
Glucose, Bld: 107 mg/dL — ABNORMAL HIGH (ref 70–99)
Potassium: 3.9 mmol/L (ref 3.5–5.1)
Sodium: 131 mmol/L — ABNORMAL LOW (ref 135–145)
Total Bilirubin: 0.9 mg/dL (ref <1.2)
Total Protein: 6.7 g/dL (ref 6.5–8.1)

## 2023-04-29 LAB — LIPASE, BLOOD: Lipase: <10 U/L — ABNORMAL LOW (ref 11–51)

## 2023-04-29 MED ORDER — CEFTRIAXONE SODIUM 1 G IJ SOLR
1.0000 g | Freq: Once | INTRAMUSCULAR | Status: AC
  Administered 2023-04-29: 1 g via INTRAVENOUS
  Filled 2023-04-29: qty 10

## 2023-04-29 MED ORDER — ONDANSETRON HCL 4 MG/2ML IJ SOLN
4.0000 mg | Freq: Once | INTRAMUSCULAR | Status: AC
  Administered 2023-04-29: 4 mg via INTRAVENOUS
  Filled 2023-04-29: qty 2

## 2023-04-29 MED ORDER — HYDROMORPHONE HCL 1 MG/ML IJ SOLN
0.5000 mg | Freq: Once | INTRAMUSCULAR | Status: AC
Start: 2023-04-29 — End: 2023-04-29
  Administered 2023-04-29: 0.5 mg via INTRAVENOUS
  Filled 2023-04-29: qty 1

## 2023-04-29 MED ORDER — LEVOFLOXACIN 500 MG PO TABS
500.0000 mg | ORAL_TABLET | Freq: Every day | ORAL | 0 refills | Status: DC
Start: 2023-04-29 — End: 2024-02-19

## 2023-04-29 MED ORDER — ETODOLAC 300 MG PO CAPS: 300.0000 mg | ORAL_CAPSULE | Freq: Three times a day (TID) | ORAL | 0 refills | Status: DC | PRN

## 2023-04-29 MED ORDER — IOHEXOL 300 MG/ML  SOLN
100.0000 mL | Freq: Once | INTRAMUSCULAR | Status: AC | PRN
  Administered 2023-04-29: 80 mL via INTRAVENOUS

## 2023-04-29 NOTE — ED Triage Notes (Signed)
Pt c/o right side flank pain, starts at her lower abdomen and goes around to her back. +Nausea and diarrhea.

## 2023-04-29 NOTE — ED Provider Notes (Signed)
[place] EMERGENCY DEPARTMENT AT [facility] Provider Note   CSN: [id number] Arrival date & time: 04/29/23  0801     History  Chief Complaint  Patient presents with   Abdominal Pain   Flank Pain    Judy Lindsey is a 87 y.o. female.   Abdominal Pain Flank Pain Associated symptoms include abdominal pain.     Patient has a history of hyponatremia pneumonia hypertension aortic stenosis hypercholesterolemia.  She has had prior abdominal surgeries including hysterectomy cholecystectomy appendectomy hernia surgery and tummy tuck.  Patient states she started having pain yesterday in her abdomen.  The pain is sharp and severe and radiates to her back.  Pain is primarily in the right upper abdomen.  She has had some loose stool.  No nausea vomiting.  No dysuria.  Patient states she is recovering from a cold last week.  She had been coughing a lot but that has decreased.  Patient does notice that the pain increases when she takes a deep breath  Home Medications Prior to Admission medications   Medication Sig Start Date End Date Taking? Authorizing Provider  etodolac (LODINE) 300 MG capsule Take 1 capsule (300 mg total) by mouth 3 (three) times daily as needed. 04/29/23  Yes Linwood Dibbles, MD  levofloxacin (LEVAQUIN) 500 MG tablet Take 1 tablet (500 mg total) by mouth daily. 04/29/23  Yes Linwood Dibbles, MD  amLODipine (NORVASC) 5 MG tablet Take 0.5 tablets (2.5 mg total) by mouth daily. 07/12/19   Jake Bathe, MD  benazepril (LOTENSIN) 20 MG tablet TAKE 1 TABLET BY MOUTH EVERY DAY 02/20/20   Jake Bathe, MD  betamethasone dipropionate (DIPROLENE) 0.05 % cream Apply 1 application topically daily as needed (eczema).  05/29/15   [provider]  COVID-19 mRNA bivalent vaccine, Moderna, (MODERNA COVID-19 BIVAL BOOSTER) 50 MCG/0.5ML injection Inject into the muscle. Patient not taking: Reported on 06/24/2022 03/11/21   Judyann Munson, MD  gabapentin (NEURONTIN) 100 MG capsule  Take 100 mg by mouth at bedtime.    [provider]  levothyroxine (SYNTHROID) 88 MCG tablet Take 88 mcg by mouth every morning. 06/10/19   [provider]  omeprazole (PRILOSEC) 40 MG capsule Take 40 mg by mouth daily as needed. 01/02/19   [provider]  Probiotic Product (ALIGN PO) Take 1 tablet by mouth daily.    [provider]  rosuvastatin (CRESTOR) 10 MG tablet Take 10 mg by mouth daily. Patient not taking: Reported on 06/24/2022    [provider]  traMADol (ULTRAM) 50 MG tablet Take 50 mg by mouth every 6 (six) hours as needed for moderate pain.    [provider]      Allergies    Codeine, Nubain [nalbuphine], Atorvastatin, and Rosuvastatin    Review of Systems   Review of Systems  Gastrointestinal:  Positive for abdominal pain.  Genitourinary:  Positive for flank pain.    Physical Exam Updated Vital Signs BP (!) 140/65   Pulse 81   Temp 99.1 F (37.3 C) (Oral)   Resp 18   SpO2 92%  Physical Exam Vitals and nursing note reviewed.  Constitutional:      Appearance: She is well-developed. She is ill-appearing.  HENT:     Head: Normocephalic and atraumatic.     Right Ear: External ear normal.     Left Ear: External ear normal.  Eyes:     General: No scleral icterus.       Right eye: No discharge.  Left eye: No discharge.     Conjunctiva/sclera: Conjunctivae normal.  Neck:     Trachea: No tracheal deviation.  Cardiovascular:     Rate and Rhythm: Normal rate and regular rhythm.  Pulmonary:     Effort: Pulmonary effort is normal. No respiratory distress.     Breath sounds: Normal breath sounds. No stridor. No wheezing or rales.  Abdominal:     General: Bowel sounds are normal. There is no distension.     Palpations: Abdomen is soft.     Tenderness: There is abdominal tenderness in the right upper quadrant. There is guarding. There is no rebound.  Musculoskeletal:        General: No tenderness or  deformity.     Cervical back: Neck supple.  Skin:    General: Skin is warm and dry.     Findings: No rash.  Neurological:     General: No focal deficit present.     Mental Status: She is alert.     Cranial Nerves: No cranial nerve deficit, dysarthria or facial asymmetry.     Sensory: No sensory deficit.     Motor: No abnormal muscle tone or seizure activity.     Coordination: Coordination normal.  Psychiatric:        Mood and Affect: Mood normal.     ED Results / Procedures / Treatments   Labs (all labs ordered are listed, but only abnormal results are displayed) Labs Reviewed  COMPREHENSIVE METABOLIC PANEL - Abnormal; Notable for the following components:      Result Value   Sodium 131 (*)    Chloride 97 (*)    Glucose, Bld 107 (*)    BUN 6 (*)    Creatinine, Ser 0.41 (*)    Calcium 8.7 (*)    AST 11 (*)    All other components within normal limits  LIPASE, BLOOD - Abnormal; Notable for the following components:   Lipase <10 (*)    All other components within normal limits  CBC WITH DIFFERENTIAL/PLATELET - Abnormal; Notable for the following components:   WBC 17.3 (*)    RBC 3.72 (*)    Hemoglobin 11.2 (*)    HCT 33.4 (*)    Neutro Abs 14.3 (*)    Monocytes Absolute 1.2 (*)    Abs Immature Granulocytes 0.15 (*)    All other components within normal limits  URINALYSIS, ROUTINE W REFLEX MICROSCOPIC - Abnormal; Notable for the following components:   Hgb urine dipstick MODERATE (*)    Ketones, ur >80 (*)    Protein, ur TRACE (*)    Leukocytes,Ua MODERATE (*)    Bacteria, UA RARE (*)    All other components within normal limits  URINE CULTURE    EKG None  Radiology CT ABDOMEN PELVIS W CONTRAST Result Date: 04/29/2023 CLINICAL DATA:  Right flank pain. EXAM: CT ABDOMEN AND PELVIS WITH CONTRAST TECHNIQUE: Multidetector CT imaging of the abdomen and pelvis was performed using the standard protocol following bolus administration of intravenous contrast. RADIATION  DOSE REDUCTION: This exam was performed according to the departmental dose-optimization program which includes automated exposure control, adjustment of the mA and/or kV according to patient size and/or use of iterative reconstruction technique. CONTRAST:  80mL OMNIPAQUE IOHEXOL 300 MG/ML  SOLN COMPARISON:  Oct 07, 2017. FINDINGS: Lower chest: Mild right posterior basilar atelectasis or infiltrate is noted. Hepatobiliary: Status post cholecystectomy. Mild intrahepatic and extrahepatic biliary dilatation is noted most likely due to post cholecystectomy status. Stable probable choledochal  cyst is noted. Pancreas: Unremarkable. No pancreatic ductal dilatation or surrounding inflammatory changes. Spleen: Normal in size without focal abnormality. Adrenals/Urinary Tract: Adrenal glands appear normal. Stable left renal cysts are noted for which no further follow-up is required. No hydronephrosis or renal obstruction is noted. Left-sided bladder diverticulum is noted. Stomach/Bowel: Stomach is unremarkable. Status post appendectomy. There is no evidence of bowel obstruction or inflammation. Scattered diverticulosis is again noted without inflammation. Vascular/Lymphatic: Aortic atherosclerosis. No enlarged abdominal or pelvic lymph nodes. Reproductive: Status post hysterectomy. No adnexal masses. Other: No abdominal wall hernia or abnormality. No abdominopelvic ascites. Musculoskeletal: No acute or significant osseous findings. IMPRESSION: Mild right posterior basilar atelectasis or pneumonia is noted. Stable chronic findings are noted as described above. No acute abnormality seen in the abdomen or pelvis. Aortic Atherosclerosis (ICD10-I70.0). Electronically Signed   By: Lupita Raider M.D.   On: 04/29/2023 10:52   DG Chest 2 View Result Date: 04/29/2023 CLINICAL DATA:  Right upper quadrant abdominal and pleuritic chest pain EXAM: CHEST - 2 VIEW COMPARISON:  12/22/2016 FINDINGS: Cardiomediastinal silhouette and  pulmonary vasculature are within normal limits. Bandlike opacity at the right lung base favored to be atelectasis rather than pneumonia. Right hemidiaphragm is mildly elevated. Levoconvex scoliosis of the thoracolumbar spine. Moderate bilateral glenohumeral osteoarthrosis. IMPRESSION: Right basilar atelectasis/scarring. Otherwise no acute cardiopulmonary process. Electronically Signed   By: Acquanetta Belling M.D.   On: 04/29/2023 08:41    Procedures Procedures    Medications Ordered in ED Medications  HYDROmorphone (DILAUDID) injection 0.5 mg (0.5 mg Intravenous Given 04/29/23 0831)  ondansetron (ZOFRAN) injection 4 mg (4 mg Intravenous Given 04/29/23 0830)  cefTRIAXone (ROCEPHIN) 1 g in sodium chloride 0.9 % 100 mL IVPB (0 g Intravenous Stopped 04/29/23 1020)  iohexol (OMNIPAQUE) 300 MG/ML solution 100 mL (80 mLs Intravenous Contrast Given 04/29/23 2952)    ED Course/ Medical Decision Making/ A&P Clinical Course as of 04/29/23 1107  Wed Apr 29, 2023  0913 Metabolic panel shows decreased sodium level, slightly decreased compared to previous.  CBC does show elevated white blood cell count.  Urinalysis does suggest possible urinary tract infection [JK]  1057 CT scan shows possible right basilar atelectasis or pneumonia.  No acute finding noted in the abdomen or pelvis [JK]    Clinical Course User Index [JK] Linwood Dibbles, MD                                 Medical Decision Making Problems Addressed: Acute cystitis without hematuria: acute illness or injury that poses a threat to life or bodily functions Flank pain: acute illness or injury that poses a threat to life or bodily functions  Amount and/or Complexity of Data Reviewed Labs: ordered. Decision-making details documented in ED Course. Radiology: ordered and independent interpretation performed.  Risk Prescription drug management.   Patient presented to ED with complaints of abdominal pain.  I was concerned about the possibility of  colitis, diverticulitis pyelonephritis ureterolithiasis.  Patient also mentions some pain with deep breathing so pneumonia was a consideration.  Patient's laboratory tests were notable for an elevated white blood cell count.  Her urinalysis did show moderate leukocyte esterase 11-20 white blood cells and rare bacteria suggestive of infection.  Chest x-ray shows possible atelectasis versus pneumonia.  CT scan does not show any acute abnormality other than the pulmonary finding.  No evidence of ureterolithiasis.  No colitis or diverticulitis.  Patient is nontoxic and  afebrile.  I will discharge home on a course of antibiotics for kidney bladder infection.  Will start Levaquin so will also cover pulmonary sources.  Evaluation and diagnostic testing in the emergency department does not suggest an emergent condition requiring admission or immediate intervention beyond what has been performed at this time.  The patient is safe for discharge and has been instructed to return immediately for worsening symptoms, change in symptoms or any other concerns.         Final Clinical Impression(s) / ED Diagnoses Final diagnoses:  Acute cystitis without hematuria  Flank pain    Rx / DC Orders ED Discharge Orders          Ordered    levofloxacin (LEVAQUIN) 500 MG tablet  Daily        04/29/23 1103    etodolac (LODINE) 300 MG capsule  3 times daily PRN       Note to Pharmacy: As needed for pain   04/29/23 1106              Linwood Dibbles, MD 04/29/23 1109

## 2023-04-29 NOTE — ED Notes (Signed)
Called lab to run urine culture.

## 2023-04-29 NOTE — Discharge Instructions (Addendum)
The urine sample suggestive that you are having a bladder/kidney infection.  The CT scan did not show any acute abnormality in your abdomen or pelvis.  There is the possibility of a pneumonia on the right side.  The antibiotic prescribed should cover both urinary as well as pulmonary infections.  Follow-up with your doctor to be rechecked.

## 2023-04-30 LAB — URINE CULTURE: Culture: <10000 — AB

## 2023-06-01 DIAGNOSIS — M542 Cervicalgia: Secondary | ICD-10-CM | POA: Diagnosis not present

## 2023-06-01 DIAGNOSIS — M47816 Spondylosis without myelopathy or radiculopathy, lumbar region: Secondary | ICD-10-CM | POA: Diagnosis not present

## 2023-07-31 ENCOUNTER — Encounter: Payer: Self-pay | Admitting: Cardiology

## 2023-07-31 ENCOUNTER — Ambulatory Visit: Payer: Medicare Other | Attending: Cardiology | Admitting: Cardiology

## 2023-07-31 VITALS — BP 132/86 | HR 70 | Ht 62.0 in | Wt 141.0 lb

## 2023-07-31 DIAGNOSIS — E7800 Pure hypercholesterolemia, unspecified: Secondary | ICD-10-CM | POA: Diagnosis not present

## 2023-07-31 DIAGNOSIS — I350 Nonrheumatic aortic (valve) stenosis: Secondary | ICD-10-CM | POA: Diagnosis not present

## 2023-07-31 DIAGNOSIS — I10 Essential (primary) hypertension: Secondary | ICD-10-CM

## 2023-07-31 NOTE — Patient Instructions (Signed)
 Follow-Up: At [facility], you and your health needs are our priority.  As part of our continuing mission to provide you with exceptional heart care, we have created designated Provider Care Teams.  These Care Teams include your primary Cardiologist (physician) and Advanced Practice Providers (APPs -  Physician Assistants and Nurse Practitioners) who all work together to provide you with the care you need, when you need it.   Your next appointment:   1 year(s)  Provider:   Donato Schultz, MD      1st Floor: - Lobby - Registration  - Pharmacy  - Lab - Cafe  2nd Floor: - PV Lab - Diagnostic Testing (echo, CT, nuclear med)  3rd Floor: - Vacant  4th Floor: - TCTS (cardiothoracic surgery) - AFib Clinic - Structural Heart Clinic - Vascular Surgery  - Vascular Ultrasound  5th Floor: - HeartCare Cardiology (general and EP) - Clinical Pharmacy for coumadin, hypertension, lipid, weight-loss medications, and med management appointments    Valet parking services will be available as well.

## 2023-07-31 NOTE — Progress Notes (Signed)
 Cardiology Office Note:  .   Date:  07/31/2023  ID:  Judy Lindsey, DOB Mar 22, 1936, MRN [id number] PCP: Ollen Bowl, MD  [facility] Providers Cardiologist:  Donato Schultz, MD    History of Present Illness: .   Judy Lindsey is a 88 y.o. female Discussed the use of AI scribe software for clinical note transcription with the patient, who gave verbal consent to proceed.  History of Present Illness Judy Lindsey is a 88 year old female with hypertension who presents for follow-up.  Blood pressure readings at home have been generally stable, although today's reading was slightly higher than usual. She is currently taking amlodipine 2.5 mg daily and benazepril 20 mg daily for blood pressure management.  In 2021, an echocardiogram showed mild aortic valve stenosis, which progressed to mild to moderate stenosis by February 2024. She is asymptomatic with no chest pain, shortness of breath, or fainting episodes. She is awaiting the results of a recent echocardiogram performed today.  She is on Zetia for hyperlipidemia, as her LDL was previously 194 mg/dL, which was considered very high.  She discusses her sleep issues, noting that she has tried melatonin, which only worked for a night or two. She suspects that inflammation, possibly related to rheumatoid changes, might be affecting her sleep.        Studies Reviewed: Marland Kitchen   EKG Interpretation Date/Time:  Friday July 31 2023 09:56:20 EDT Ventricular Rate:  70 PR Interval:  172 QRS Duration:  96 QT Interval:  404 QTC Calculation: 436 R Axis:   -52  Text Interpretation: Normal sinus rhythm Left axis deviation When compared with ECG of 19-Dec-2016 06:17, No significant change since last tracing Confirmed by Donato Schultz ([id number]) on 07/31/2023 10:26:14 AM    Results LABS LDL: 194  DIAGNOSTIC Echocardiogram: Mild to moderate aortic valve stenosis (06/2022) Risk Assessment/Calculations:            Physical Exam:   VS:  BP  132/86   Pulse 70   Ht 5\' 2"  (1.575 m)   Wt 141 lb (64 kg)   SpO2 97%   BMI 25.79 kg/m    Wt Readings from Last 3 Encounters:  07/31/23 141 lb (64 kg)  06/24/22 144 lb 12.8 oz (65.7 kg)  02/20/21 141 lb (64 kg)    GEN: Well nourished, well developed in no acute distress NECK: No JVD; No carotid bruits CARDIAC: RRR, 2/6 SM, no rubs, no gallops RESPIRATORY:  Clear to auscultation without rales, wheezing or rhonchi  ABDOMEN: Soft, non-tender, non-distended EXTREMITIES:  No edema; No deformity   ASSESSMENT AND PLAN: .    Assessment and Plan Assessment & Plan Nonrheumatic aortic valve stenosis The February 2024 echocardiogram indicated mild to moderate aortic valve stenosis, with a slight progression from previous findings. Absence of symptoms such as angina, dyspnea, or syncope suggests no need for surgical intervention currently. If progression to severe symptomatic stenosis occurs, TAVR (transcatheter aortic valve replacement) will be considered, offering a minimally invasive approach with rapid recovery. - Review today's echocardiogram results and communicate findings directly to her - Plan annual follow-up with repeat echocardiogram if stenosis remains mild to moderate  Essential hypertension Blood pressure readings are slightly elevated but within acceptable limits. She is on amlodipine 2.5 mg daily and benazepril 20 mg daily. No medication adjustments are necessary. Medication refills can be coordinated through her primary care physician. - Continue amlodipine 2.5 mg oral daily - Continue benazepril 20 mg oral daily - Coordinate  medication refills through primary care physician if preferred  Pure hypercholesterolemia She is on Zetia for hypercholesterolemia. Previous LDL levels were significantly elevated at 194 mg/dL. A follow-up is scheduled in a few weeks to evaluate Zetia's effectiveness in reducing LDL levels. - Continue Zetia as prescribed - Review cholesterol levels at  upcoming follow-up appointment  Arthritis She reports rheumatoid changes and inflammation, potentially contributing to sleep disturbances. Plans to discuss management with her primary care physician. - Discuss arthritis management with primary care physician         1 yr  Signed, Donato Schultz, MD

## 2023-08-05 DIAGNOSIS — E7800 Pure hypercholesterolemia, unspecified: Secondary | ICD-10-CM | POA: Diagnosis not present

## 2023-08-05 DIAGNOSIS — E039 Hypothyroidism, unspecified: Secondary | ICD-10-CM | POA: Diagnosis not present

## 2023-08-05 DIAGNOSIS — I700 Atherosclerosis of aorta: Secondary | ICD-10-CM | POA: Diagnosis not present

## 2023-08-05 DIAGNOSIS — I10 Essential (primary) hypertension: Secondary | ICD-10-CM | POA: Diagnosis not present

## 2023-08-05 DIAGNOSIS — Q253 Supravalvular aortic stenosis: Secondary | ICD-10-CM | POA: Diagnosis not present

## 2023-08-31 DIAGNOSIS — K080 Exfoliation of teeth due to systemic causes: Secondary | ICD-10-CM | POA: Diagnosis not present

## 2023-09-11 DIAGNOSIS — K4191 Unilateral femoral hernia, without obstruction or gangrene, recurrent: Secondary | ICD-10-CM | POA: Diagnosis not present

## 2023-09-23 ENCOUNTER — Telehealth: Payer: Self-pay

## 2023-09-23 ENCOUNTER — Ambulatory Visit: Payer: Self-pay | Admitting: General Surgery

## 2023-09-23 DIAGNOSIS — K432 Incisional hernia without obstruction or gangrene: Secondary | ICD-10-CM | POA: Diagnosis not present

## 2023-09-23 NOTE — Telephone Encounter (Signed)
   Pre-operative Risk Assessment    Patient Name: Judy Lindsey  DOB: Jan 22, 1936 MRN: [id number]   Date of last office visit: 07/31/23 Dorothye Gathers, MD Date of next office visit: NONE   Request for Surgical Clearance    Procedure:  HERNIA SURGERY  Date of Surgery:  Clearance TBD                                Surgeon:  Shela Derby, MD Surgeon's Group or Practice Name:  [facility] Phone number:  [phone] Fax number:  [phone]  ATTN: Eladio Graven, CMA   Type of Clearance Requested:   - Medical    Type of Anesthesia:  General    Additional requests/questions:    Signed, Collin Deal   09/23/2023, 3:01 PM

## 2023-09-23 NOTE — H&P (Signed)
 Chief Complaint: New Consultation (Recurrent right femoral hernia)     History of Present Illness: Judy Lindsey is a 88 y.o. female who is seen today as an office consultation at the request of Dr. Lilyan Remedies for evaluation of New Consultation (Recurrent right femoral hernia) .   History of Present Illness Judy Lindsey is an 88 year old female who presents with a recurrent right-sided abdominal hernia.  She has experienced this hernia for one to two years, with discomfort primarily in the mornings and before bowel movements. The discomfort improves as the day progresses. A noticeable lump is present on the right side of her abdomen. Chronic constipation exacerbates the pain in the hernia area. She underwent a tummy tuck approximately 25 years ago, during which a hernia was incidentally found and repaired. A severe pain episode around December or January led to a CT scan at [facility], but she was not informed of any hernia findings at that time. No issues are reported on the left side of the abdomen.  CT does shoes a RLQ hernia could be spigelian vs incisional      Review of Systems: A complete review of systems was obtained from the patient.  I have reviewed this information and discussed as appropriate with the patient.  See HPI as well for other ROS.  Review of Systems  Constitutional:  Negative for fever.  HENT:  Negative for congestion.   Eyes:  Negative for blurred vision.  Respiratory:  Negative for cough, shortness of breath and wheezing.   Cardiovascular:  Negative for chest pain and palpitations.  Gastrointestinal:  Negative for heartburn.  Genitourinary:  Negative for dysuria.  Musculoskeletal:  Negative for myalgias.  Skin:  Negative for rash.  Neurological:  Negative for dizziness and headaches.  Psychiatric/Behavioral:  Negative for depression and suicidal ideas.   All other systems reviewed and are negative.     Medical History: Past Medical  History: Diagnosis Date  Arthritis   Hyperlipidemia   Hypertension    There is no problem list on file for this patient.   Past Surgical History: Procedure Laterality Date  CHOLECYSTECTOMY    HYSTERECTOMY    JOINT REPLACEMENT    Tummy Tuck    23 years ago    Allergies Allergen Reactions  Codeine Other (See Comments) and Nausea And Vomiting  Nalbuphine Other (See Comments) and Nausea And Vomiting  Atorvastatin Other (See Comments) and Rash   Body aches  Rosuvastatin  Other (See Comments) and Rash   Body aches  Sulfa (Sulfonamide Antibiotics) Rash   Current Outpatient Medications on File Prior to Visit Medication Sig Dispense Refill  amLODIPine  (NORVASC ) 2.5 MG tablet Take 2.5 mg by mouth once daily    benazepriL  (LOTENSIN ) 20 MG tablet Take 20 mg by mouth once daily    ezetimibe (ZETIA) 10 mg tablet Take 10 mg by mouth once daily    ipratropium (ATROVENT) 0.06 % nasal spray USE 1 TO 2 SPRAYS IN EACH NOSTRIL TWICE DAILY AS NEEDED FOR DRAINAGE    levothyroxine  (SYNTHROID ) 88 MCG tablet take 1 tablet by mouth daily in the morning on an empty stomach    lubiprostone (AMITIZA) 24 MCG capsule Take by mouth    traMADoL  (ULTRAM ) 50 mg tablet TAKE 1 TABLET BY MOUTH EVERY 8-12 HOURS AS NEEDED FOR PAIN    No current facility-administered medications on file prior to visit.   Family History Problem Relation Age of Onset  High blood pressure (Hypertension) Mother   Hyperlipidemia (Elevated  cholesterol) Mother   Skin cancer Father   High blood pressure (Hypertension) Father   Hyperlipidemia (Elevated cholesterol) Father   Coronary Artery Disease (Blocked arteries around heart) Father     Social History  Tobacco Use Smoking Status Never Smokeless Tobacco Never    Social History  Socioeconomic History  Marital status: Married Tobacco Use  Smoking status: Never  Smokeless tobacco: Never Vaping Use  Vaping status: Never Used Substance and Sexual Activity  Alcohol  use: Not Currently  Drug use: Never  Social Drivers of Health  Housing Stability: Unknown (09/23/2023)  Housing Stability Vital Sign   Homeless in the Last Year: No   Objective:   Vitals:  09/23/23 1322 09/23/23 1323 BP: (!) 168/80  Pulse: 72  Temp: 36.6 C (97.8 F)  SpO2: 99%  Weight: 65.5 kg (144 lb 6.4 oz)  Height: 160 cm (5\' 3" )  PainSc:  0-No pain   Body mass index is 25.58 kg/m.  Physical Exam Constitutional:      Appearance: Normal appearance.  HENT:     Head: Normocephalic and atraumatic.     Mouth/Throat:     Mouth: Mucous membranes are moist.     Pharynx: Oropharynx is clear.  Eyes:     General: No scleral icterus.    Pupils: Pupils are equal, round, and reactive to light.  Cardiovascular:     Rate and Rhythm: Normal rate and regular rhythm.     Pulses: Normal pulses.     Heart sounds: No murmur heard.    No friction rub. No gallop.  Pulmonary:     Effort: Pulmonary effort is normal. No respiratory distress.     Breath sounds: Normal breath sounds. No stridor.  Abdominal:     General: Abdomen is flat.     Hernia: A hernia is present.  Musculoskeletal:        General: No swelling.  Skin:    General: Skin is warm.  Neurological:     General: No focal deficit present.     Mental Status: She is alert and oriented to person, place, and time. Mental status is at baseline.  Psychiatric:        Mood and Affect: Mood normal.        Thought Content: Thought content normal.        Judgment: Judgment normal.      Assessment and Plan: Diagnoses and all orders for this visit:  Incisional hernia without obstruction or gangrene    Judy Lindsey is a 88 y.o. female   1.  We will proceed to the OR for a robotic ventral hernia repair with mesh. 2. All risks and benefits were discussed with the patient, to generally include infection, bleeding, damage to surrounding structures, acute and chronic nerve pain, and recurrence. Alternatives were offered and  described.  All questions were answered and the patient voiced understanding of the procedure and wishes to proceed at this point.       No follow-ups on file.  Shela Derby, MD, [facility], [facility]

## 2023-09-25 NOTE — Telephone Encounter (Signed)
   Patient Name: Judy Lindsey  DOB: 06-23-1935 MRN: [id number]  Primary Cardiologist: Dorothye Gathers, MD  Chart reviewed as part of pre-operative protocol coverage. Given past medical history and time since last visit, based on ACC/AHA guidelines, [name] is at acceptable risk for the planned procedure without further cardiovascular testing.   I will route this recommendation to the requesting party via Epic fax function and remove from pre-op pool.  Please call with questions.  Ava Boatman, NP 09/25/2023, 7:29 AM

## 2023-10-09 DIAGNOSIS — N3010 Interstitial cystitis (chronic) without hematuria: Secondary | ICD-10-CM | POA: Diagnosis not present

## 2023-10-09 DIAGNOSIS — R300 Dysuria: Secondary | ICD-10-CM | POA: Diagnosis not present

## 2023-10-09 DIAGNOSIS — I10 Essential (primary) hypertension: Secondary | ICD-10-CM | POA: Diagnosis not present

## 2023-10-29 ENCOUNTER — Encounter (INDEPENDENT_AMBULATORY_CARE_PROVIDER_SITE_OTHER): Payer: Self-pay | Admitting: Otolaryngology

## 2023-10-29 ENCOUNTER — Ambulatory Visit (INDEPENDENT_AMBULATORY_CARE_PROVIDER_SITE_OTHER): Admitting: Otolaryngology

## 2023-10-29 VITALS — BP 133/77 | HR 82 | Ht 62.5 in | Wt 140.0 lb

## 2023-10-29 DIAGNOSIS — J310 Chronic rhinitis: Secondary | ICD-10-CM

## 2023-10-29 DIAGNOSIS — J3481 Nasal mucositis (ulcerative): Secondary | ICD-10-CM

## 2023-10-29 DIAGNOSIS — L0100 Impetigo, unspecified: Secondary | ICD-10-CM

## 2023-10-29 DIAGNOSIS — R0981 Nasal congestion: Secondary | ICD-10-CM | POA: Diagnosis not present

## 2023-10-29 DIAGNOSIS — R0982 Postnasal drip: Secondary | ICD-10-CM

## 2023-10-29 MED ORDER — IPRATROPIUM BROMIDE 0.06 % NA SOLN: 2.0000 | Freq: Two times a day (BID) | NASAL | 12 refills | Status: AC | PRN

## 2023-10-29 MED ORDER — MUPIROCIN 2 % EX OINT: 1.0000 | TOPICAL_OINTMENT | Freq: Three times a day (TID) | CUTANEOUS | 3 refills | Status: AC

## 2023-10-30 ENCOUNTER — Emergency Department (HOSPITAL_BASED_OUTPATIENT_CLINIC_OR_DEPARTMENT_OTHER)

## 2023-10-30 ENCOUNTER — Emergency Department (HOSPITAL_BASED_OUTPATIENT_CLINIC_OR_DEPARTMENT_OTHER)
Admission: EM | Admit: 2023-10-30 | Discharge: 2023-10-30 | Disposition: A | Discharge status: Home / Self Care | Attending: Emergency Medicine | Admitting: Emergency Medicine

## 2023-10-30 ENCOUNTER — Encounter (HOSPITAL_BASED_OUTPATIENT_CLINIC_OR_DEPARTMENT_OTHER): Payer: Self-pay

## 2023-10-30 ENCOUNTER — Other Ambulatory Visit: Payer: Self-pay

## 2023-10-30 ENCOUNTER — Emergency Department (HOSPITAL_BASED_OUTPATIENT_CLINIC_OR_DEPARTMENT_OTHER): Admitting: Radiology

## 2023-10-30 DIAGNOSIS — R220 Localized swelling, mass and lump, head: Secondary | ICD-10-CM | POA: Diagnosis not present

## 2023-10-30 DIAGNOSIS — W010XXA Fall on same level from slipping, tripping and stumbling without subsequent striking against object, initial encounter: Secondary | ICD-10-CM | POA: Insufficient documentation

## 2023-10-30 DIAGNOSIS — W19XXXA Unspecified fall, initial encounter: Secondary | ICD-10-CM

## 2023-10-30 DIAGNOSIS — S60211A Contusion of right wrist, initial encounter: Secondary | ICD-10-CM | POA: Diagnosis not present

## 2023-10-30 DIAGNOSIS — M25561 Pain in right knee: Secondary | ICD-10-CM | POA: Diagnosis not present

## 2023-10-30 DIAGNOSIS — M25521 Pain in right elbow: Secondary | ICD-10-CM | POA: Insufficient documentation

## 2023-10-30 DIAGNOSIS — S0990XA Unspecified injury of head, initial encounter: Secondary | ICD-10-CM | POA: Diagnosis not present

## 2023-10-30 DIAGNOSIS — M1711 Unilateral primary osteoarthritis, right knee: Secondary | ICD-10-CM | POA: Diagnosis not present

## 2023-10-30 DIAGNOSIS — I6782 Cerebral ischemia: Secondary | ICD-10-CM | POA: Diagnosis not present

## 2023-10-30 DIAGNOSIS — S0083XA Contusion of other part of head, initial encounter: Secondary | ICD-10-CM | POA: Insufficient documentation

## 2023-10-30 DIAGNOSIS — M25531 Pain in right wrist: Secondary | ICD-10-CM | POA: Diagnosis not present

## 2023-10-30 DIAGNOSIS — M1811 Unilateral primary osteoarthritis of first carpometacarpal joint, right hand: Secondary | ICD-10-CM | POA: Diagnosis not present

## 2023-10-30 DIAGNOSIS — M79631 Pain in right forearm: Secondary | ICD-10-CM | POA: Insufficient documentation

## 2023-10-30 DIAGNOSIS — S6981XA Other specified injuries of right wrist, hand and finger(s), initial encounter: Secondary | ICD-10-CM | POA: Diagnosis not present

## 2023-10-30 LAB — BASIC METABOLIC PANEL WITH GFR
Anion gap: 14 (ref 5–15)
BUN: 11 mg/dL (ref 8–23)
CO2: 23 mmol/L (ref 22–32)
Calcium: 9.8 mg/dL (ref 8.9–10.3)
Chloride: 102 mmol/L (ref 98–111)
Creatinine, Ser: 0.48 mg/dL (ref 0.44–1.00)
GFR, Estimated: >60 mL/min (ref >60)
Glucose, Bld: 93 mg/dL (ref 70–99)
Potassium: 4.1 mmol/L (ref 3.5–5.1)
Sodium: 138 mmol/L (ref 135–145)

## 2023-10-30 LAB — CBC
HCT: 34.4 % — ABNORMAL LOW (ref 36.0–46.0)
Hemoglobin: 11.6 g/dL — ABNORMAL LOW (ref 12.0–15.0)
MCH: 30.3 pg (ref 26.0–34.0)
MCHC: 33.7 g/dL (ref 30.0–36.0)
MCV: 89.8 fL (ref 80.0–100.0)
Platelets: 224 10*3/uL (ref 150–400)
RBC: 3.83 MIL/uL — ABNORMAL LOW (ref 3.87–5.11)
RDW: 12.7 % (ref 11.5–15.5)
WBC: 9 10*3/uL (ref 4.0–10.5)
nRBC: 0 % (ref 0.0–0.2)

## 2023-10-30 MED ORDER — ONDANSETRON HCL 4 MG/2ML IJ SOLN
4.0000 mg | Freq: Once | INTRAMUSCULAR | Status: AC
  Administered 2023-10-30: 4 mg via INTRAVENOUS
  Filled 2023-10-30: qty 2

## 2023-10-30 MED ORDER — KETOROLAC TROMETHAMINE 30 MG/ML IJ SOLN
30.0000 mg | Freq: Once | INTRAMUSCULAR | Status: AC
  Administered 2023-10-30: 30 mg via INTRAVENOUS
  Filled 2023-10-30: qty 1

## 2023-10-30 MED ORDER — ONDANSETRON 4 MG PO TBDP
4.0000 mg | ORAL_TABLET | Freq: Three times a day (TID) | ORAL | 0 refills | Status: DC | PRN
Start: 2023-10-30 — End: 2024-02-19

## 2023-10-30 MED ORDER — OXYCODONE HCL 10 MG PO TABS: 5.0000 mg | ORAL_TABLET | Freq: Four times a day (QID) | ORAL | 0 refills | Status: AC | PRN

## 2023-10-30 MED ORDER — OXYCODONE-ACETAMINOPHEN 5-325 MG PO TABS
2.0000 | ORAL_TABLET | Freq: Once | ORAL | Status: AC
  Administered 2023-10-30: 2 via ORAL
  Filled 2023-10-30: qty 2

## 2023-10-30 MED ORDER — FENTANYL CITRATE PF 50 MCG/ML IJ SOSY
50.0000 ug | PREFILLED_SYRINGE | Freq: Once | INTRAMUSCULAR | Status: AC
  Administered 2023-10-30: 50 ug via INTRAVENOUS
  Filled 2023-10-30: qty 1

## 2023-10-30 MED ORDER — MORPHINE SULFATE (PF) 4 MG/ML IV SOLN
4.0000 mg | Freq: Once | INTRAVENOUS | Status: AC
  Administered 2023-10-30: 4 mg via INTRAVENOUS
  Filled 2023-10-30: qty 1

## 2023-10-30 NOTE — Discharge Instructions (Addendum)
 Your x-rays did not show obvious broken bones in your right forearm or wrist.  It is still possible that you have a hairline fracture or fracture that cannot be seen on initial x-rays.  Therefore recommend that you wear the splint and the sling at all times.  Also recommend that you follow-up with the orthopedic hand surgeon.  Please call the number above to request a follow-up appointment in the next 1 to 2 weeks.  Your MRI scan did not show any signs of bleeding on the brain.

## 2023-10-30 NOTE — ED Triage Notes (Addendum)
 Pt c/o mechanical fall, endorses R wrist, forearm pain, bruising/ swelling above R eye. Also c/o R knee pain  Tramadol  PTA

## 2023-10-30 NOTE — ED Provider Notes (Signed)
  EMERGENCY DEPARTMENT AT [facility] Provider Note   CSN: [id number] Arrival date & time: 10/30/23  1135     Patient presents with: Fall and Arm Pain (R)   Judy Lindsey is a 88 y.o. female presenting from home with a fall, reporting that she tripped on a wet rug outside, landed on her right side, also struck her right head pretty hard on the ground.  She is not on blood thinners.  She has a mild headache.  But her primary pain is in her right forearm and her right wrist.  She is right-handed.  She is also having pain in her right knee, though she is able to walk.  She does not have an orthopedic surgeon.   HPI     Prior to Admission medications   Medication Sig Start Date End Date Taking? Authorizing Provider  ondansetron  (ZOFRAN -ODT) 4 MG disintegrating tablet Take 1 tablet (4 mg total) by mouth every 8 (eight) hours as needed for up to 15 doses for nausea or vomiting. 10/30/23  Yes Laini Urick, Janalyn Me, MD  oxyCODONE  10 MG TABS Take 0.5 tablets (5 mg total) by mouth every 6 (six) hours as needed for up to 20 days for severe pain (pain score 7-10). 10/30/23 11/19/23 Yes Duchess Armendarez, Janalyn Me, MD  amLODipine  (NORVASC ) 5 MG tablet Take 0.5 tablets (2.5 mg total) by mouth daily. 07/12/19   Hugh Madura, MD  benazepril  (LOTENSIN ) 20 MG tablet TAKE 1 TABLET BY MOUTH EVERY DAY 02/20/20   Hugh Madura, MD  betamethasone dipropionate (DIPROLENE) 0.05 % cream Apply 1 application topically daily as needed (eczema).  05/29/15   [provider]  COVID-19 mRNA bivalent vaccine, Moderna, (MODERNA COVID-19 BIVAL BOOSTER) 50 MCG/0.5ML injection Inject into the muscle. 03/11/21   Liane Redman, MD  etodolac  (LODINE ) 300 MG capsule Take 1 capsule (300 mg total) by mouth 3 (three) times daily as needed. 04/29/23   Trish Furl, MD  ezetimibe (ZETIA) 10 MG tablet Take 10 mg by mouth daily.    [provider]  gabapentin (NEURONTIN) 100 MG capsule Take 100 mg by mouth at  bedtime.    [provider]  ipratropium (ATROVENT) 0.06 % nasal spray Place 2 sprays into both nostrils 2 (two) times daily as needed. 10/29/23 11/28/23  Reynold Caves, MD  levofloxacin  (LEVAQUIN ) 500 MG tablet Take 1 tablet (500 mg total) by mouth daily. 04/29/23   Trish Furl, MD  levothyroxine  (SYNTHROID ) 88 MCG tablet Take 88 mcg by mouth every morning. 06/10/19   [provider]  mupirocin ointment (BACTROBAN) 2 % Apply 1 Application topically 3 (three) times daily for 7 days. 10/29/23 11/05/23  Reynold Caves, MD  omeprazole (PRILOSEC) 40 MG capsule Take 40 mg by mouth daily as needed. 01/02/19   [provider]  Probiotic Product (ALIGN PO) Take 1 tablet by mouth daily.    [provider]  rosuvastatin  (CRESTOR ) 10 MG tablet Take 10 mg by mouth daily.    [provider]  traMADol  (ULTRAM ) 50 MG tablet Take 50 mg by mouth every 6 (six) hours as needed for moderate pain.    [provider]    Allergies: Codeine, Nubain [nalbuphine], Atorvastatin, and Rosuvastatin     Review of Systems  Updated Vital Signs BP (!) 168/82   Pulse 61   Temp 99.2 F (37.3 C)   Resp 18   SpO2 98%   Physical Exam Constitutional:      General: She is not in  acute distress. HENT:     Head: Normocephalic.     Comments: Hematoma to the right forehead  Eyes:     Conjunctiva/sclera: Conjunctivae normal.     Pupils: Pupils are equal, round, and reactive to light.   Neck:     Comments: No spinal midline tenderness Cardiovascular:     Rate and Rhythm: Normal rate and regular rhythm.  Pulmonary:     Effort: Pulmonary effort is normal. No respiratory distress.  Abdominal:     General: There is no distension.     Tenderness: There is no abdominal tenderness.   Musculoskeletal:     Comments: Tenderness and mild ecchymosis of the right wrist, limited range of motion due to pain, tenderness of the right forearm and the right elbow, no significant tenderness of the right  humerus or the right clavicle or shoulder, mild tenderness of the right patella with full range of motion actively and passively of the lower extremities.   Skin:    General: Skin is warm and dry.   Neurological:     General: No focal deficit present.     Mental Status: She is alert. Mental status is at baseline.   Psychiatric:        Mood and Affect: Mood normal.        Behavior: Behavior normal.     (all labs ordered are listed, but only abnormal results are displayed) Labs Reviewed  CBC - Abnormal; Notable for the following components:      Result Value   RBC 3.83 (*)    Hemoglobin 11.6 (*)    HCT 34.4 (*)    All other components within normal limits  BASIC METABOLIC PANEL WITH GFR    EKG: None  Radiology: MR BRAIN WO CONTRAST Result Date: 10/30/2023 CLINICAL DATA:  Head trauma after fall this morning. EXAM: MRI HEAD WITHOUT CONTRAST TECHNIQUE: Multiplanar, multiecho pulse sequences of the brain and surrounding structures were obtained without intravenous contrast. COMPARISON:  None Available. FINDINGS: Brain: No acute infarct. No evidence of intracranial hemorrhage. Mild scattered T2/FLAIR hyperintensity in the periventricular and subcortical white matter. No edema, mass effect, or midline shift. Posterior fossa is unremarkable. Normal appearance of midline structures. The basilar cisterns are patent. No extra-axial fluid collections. Ventricles: Normal size and configuration of the ventricles. Vascular: Skull base flow voids are visualized. Skull and upper cervical spine: No focal abnormality. Sinuses/Orbits: Bilateral lens replacement. Paranasal sinuses are clear. Other: Mastoid air cells are clear. Focal soft tissue swelling over the right supraorbital ridge extending into the right frontal scalp and extending inferiorly along the lateral aspect of the right orbit. IMPRESSION: No acute intracranial abnormality. Mild chronic microvascular ischemic changes. Right frontal scalp and  right periorbital soft tissue swelling. Electronically Signed   By: Denny Flack M.D.   On: 10/30/2023 14:29   DG Knee Complete 4 Views Right Result Date: 10/30/2023 CLINICAL DATA:  Fall.  Right-sided pain. EXAM: RIGHT KNEE - COMPLETE 4+ VIEW COMPARISON:  None Available. FINDINGS: There is diffuse decreased bone mineralization. Mild to moderate medial and mild lateral compartment joint space narrowing. Mild peripheral medial compartment degenerative spurring. Mild medial and lateral compartment chondrocalcinosis. Tiny ossicles overlying the intercondylar notch. Minimal superior patellar degenerative spurring. No joint effusion. No acute fracture or dislocation. IMPRESSION: 1. Mild to moderate medial and mild lateral and patellofemoral compartment osteoarthritis. 2. No acute fracture is seen. Electronically Signed   By: Bertina Broccoli M.D.   On: 10/30/2023 13:47   DG Wrist Complete  Right Result Date: 10/30/2023 CLINICAL DATA:  Fall on outstretched hand.  Pain. EXAM: RIGHT WRIST - COMPLETE 3+ VIEW; RIGHT ELBOW - COMPLETE 3+ VIEW; RIGHT FOREARM - 2 VIEW COMPARISON:  None Available. FINDINGS: There is diffuse decreased bone mineralization. Right wrist: Neutral ulnar variance. Moderate to severe triscaphe and thumb carpometacarpal joint space narrowing, subchondral sclerosis, subchondral cystic change, and peripheral osteophytosis. Moderate second carpometacarpal joint space narrowing. Moderate triangular fibrocartilage complex chondrocalcinosis. Moderate thumb interphalangeal joint space narrowing, subchondral sclerosis, and peripheral osteophytosis. Mild calcification dorsal to the proximal carpal row on lateral view is more proximal than would be expected for a triquetral avulsion fracture and is favored to be secondary to the calcification overlying the triangular fibrocartilage complex on frontal view. Right forearm: No acute fracture is seen within the radius or ulna. Normal alignment. Right elbow: Minimal  degenerative spurring at the medial elbow at the coronoid process. No joint effusion. No acute fracture is seen. No dislocation. IMPRESSION: 1. No acute fracture is seen within the right wrist, right forearm, or right elbow. 2. 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 Electronically Signed   By: Bertina Broccoli M.D.   On: 10/30/2023 13:45   DG Forearm Right Result Date: 10/30/2023 CLINICAL DATA:  Fall on outstretched hand.  Pain. EXAM: RIGHT WRIST - COMPLETE 3+ VIEW; RIGHT ELBOW - COMPLETE 3+ VIEW; RIGHT FOREARM - 2 VIEW COMPARISON:  None Available. FINDINGS: There is diffuse decreased bone mineralization. Right wrist: Neutral ulnar variance. Moderate to severe triscaphe and thumb carpometacarpal joint space narrowing, subchondral sclerosis, subchondral cystic change, and peripheral osteophytosis. Moderate second carpometacarpal joint space narrowing. Moderate triangular fibrocartilage complex chondrocalcinosis. Moderate thumb interphalangeal joint space narrowing, subchondral sclerosis, and peripheral osteophytosis. Mild calcification dorsal to the proximal carpal row on lateral view is more proximal than would be expected for a triquetral avulsion fracture and is favored to be secondary to the calcification overlying the triangular fibrocartilage complex on frontal view. Right forearm: No acute fracture is seen within the radius or ulna. Normal alignment. Right elbow: Minimal degenerative spurring at the medial elbow at the coronoid process. No joint effusion. No acute fracture is seen. No dislocation. IMPRESSION: 1. No acute fracture is seen within the right wrist, right forearm, or right elbow. 2. 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 Electronically Signed   By: Bertina Broccoli M.D.   On: 10/30/2023 13:45   DG Elbow Complete Right Result Date: 10/30/2023 CLINICAL DATA:  Fall on outstretched hand.  Pain. EXAM: RIGHT WRIST - COMPLETE 3+ VIEW; RIGHT  ELBOW - COMPLETE 3+ VIEW; RIGHT FOREARM - 2 VIEW COMPARISON:  None Available. FINDINGS: There is diffuse decreased bone mineralization. Right wrist: Neutral ulnar variance. Moderate to severe triscaphe and thumb carpometacarpal joint space narrowing, subchondral sclerosis, subchondral cystic change, and peripheral osteophytosis. Moderate second carpometacarpal joint space narrowing. Moderate triangular fibrocartilage complex chondrocalcinosis. Moderate thumb interphalangeal joint space narrowing, subchondral sclerosis, and peripheral osteophytosis. Mild calcification dorsal to the proximal carpal row on lateral view is more proximal than would be expected for a triquetral avulsion fracture and is favored to be secondary to the calcification overlying the triangular fibrocartilage complex on frontal view. Right forearm: No acute fracture is seen within the radius or ulna. Normal alignment. Right elbow: Minimal degenerative spurring at the medial elbow at the coronoid process. No joint effusion. No acute fracture is seen. No dislocation. IMPRESSION: 1. No acute fracture is seen within the right wrist, right forearm, or right elbow. 2. Moderate to  severe triscaphe and thumb carpometacarpal osteoarthritis. Electronically Signed   By: Bertina Broccoli M.D.   On: 10/30/2023 13:45     Procedures   Medications Ordered in the ED  morphine (PF) 4 MG/ML injection 4 mg (4 mg Intravenous Given 10/30/23 1259)  ondansetron  (ZOFRAN ) injection 4 mg (4 mg Intravenous Given 10/30/23 1258)  fentaNYL  (SUBLIMAZE ) injection 50 mcg (50 mcg Intravenous Given 10/30/23 1414)  ketorolac  (TORADOL ) 30 MG/ML injection 30 mg (30 mg Intravenous Given 10/30/23 1413)  oxyCODONE -acetaminophen  (PERCOCET/ROXICET) 5-325 MG per tablet 2 tablet (2 tablets Oral Given 10/30/23 1444)                                    Medical Decision Making Amount and/or Complexity of Data Reviewed Labs: ordered. Radiology: ordered.  Risk Prescription drug  management.   The patient is here with a mechanical fall and several traumatic injuries as noted above.  X-rays are ordered to evaluate for underlying fracture.  CT imaging was not readily available at this time for trauma evaluation in the freestanding ED, as CT machine was broken, but we do have mobile MRI available and I felt that stat MRI would be a safer option for brain trauma evaluation than the expected delay in transfer to another facility.  This is not only medically indicated but also less radiation exposure. The patient herself was comfortable with this option and preferring to complete MRI in the ED.  Her husband is here to provide supplemental history.  I personally reviewed and interpreted the patient's labs and imaging, notable for no evident emergent findings.  Patient has a mild chronic anemia.  She was given 2 rounds of pain medicine in the ED as well as oral pain medicine.  I will be prescribing her oxycodone  10 mg tablets to take at home as she is chronically on tramadol  50 mg and these are not enough to control her pain.  Her primary source of pain appears to be her right wrist.  Although there is no evident fracture noted on x-ray did explain that sometimes x-ray films miss smaller fractures.  Therefore we have placed her in a splint and sling, and I did recommend follow-up with an orthopedic hand surgeon.  Her family was present at the bedside including her husband and daughter, they verbalized understanding.  We will make sure she is steady and able to ambulate prior to discharge.     Final diagnoses:  Right wrist pain  Fall, initial encounter  Facial hematoma, initial encounter    ED Discharge Orders          Ordered    oxyCODONE  10 MG TABS  Every 6 hours PRN        10/30/23 1439    ondansetron  (ZOFRAN -ODT) 4 MG disintegrating tablet  Every 8 hours PRN        10/30/23 1458               Arvilla Birmingham, MD 10/30/23 1458

## 2023-10-31 ENCOUNTER — Emergency Department (HOSPITAL_BASED_OUTPATIENT_CLINIC_OR_DEPARTMENT_OTHER): Admission: EM | Admit: 2023-10-31 | Discharge: 2023-10-31 | Disposition: A | Discharge status: Home / Self Care

## 2023-10-31 ENCOUNTER — Encounter (HOSPITAL_BASED_OUTPATIENT_CLINIC_OR_DEPARTMENT_OTHER): Payer: Self-pay

## 2023-10-31 ENCOUNTER — Other Ambulatory Visit: Payer: Self-pay

## 2023-10-31 DIAGNOSIS — I10 Essential (primary) hypertension: Secondary | ICD-10-CM | POA: Diagnosis not present

## 2023-10-31 DIAGNOSIS — M25531 Pain in right wrist: Secondary | ICD-10-CM | POA: Diagnosis not present

## 2023-10-31 DIAGNOSIS — J3481 Nasal mucositis (ulcerative): Secondary | ICD-10-CM | POA: Insufficient documentation

## 2023-10-31 DIAGNOSIS — R0982 Postnasal drip: Secondary | ICD-10-CM | POA: Insufficient documentation

## 2023-10-31 DIAGNOSIS — Z79899 Other long term (current) drug therapy: Secondary | ICD-10-CM | POA: Insufficient documentation

## 2023-10-31 DIAGNOSIS — J310 Chronic rhinitis: Secondary | ICD-10-CM | POA: Insufficient documentation

## 2023-10-31 MED ORDER — KETOROLAC TROMETHAMINE 15 MG/ML IJ SOLN
15.0000 mg | Freq: Once | INTRAMUSCULAR | Status: AC
  Administered 2023-10-31: 15 mg via INTRAVENOUS
  Filled 2023-10-31: qty 1

## 2023-10-31 MED ORDER — FENTANYL CITRATE PF 50 MCG/ML IJ SOSY
50.0000 ug | PREFILLED_SYRINGE | Freq: Once | INTRAMUSCULAR | Status: AC
  Administered 2023-10-31: 50 ug via INTRAVENOUS
  Filled 2023-10-31: qty 1

## 2023-10-31 MED ORDER — OXYCODONE HCL 5 MG PO TABS
5.0000 mg | ORAL_TABLET | Freq: Once | ORAL | Status: AC
  Administered 2023-10-31: 5 mg via ORAL
  Filled 2023-10-31: qty 1

## 2023-10-31 MED ORDER — ACETAMINOPHEN 500 MG PO TABS
1000.0000 mg | ORAL_TABLET | Freq: Once | ORAL | Status: AC
  Administered 2023-10-31: 1000 mg via ORAL
  Filled 2023-10-31: qty 2

## 2023-10-31 NOTE — ED Provider Notes (Signed)
 [place] [facility] AT [facility] Provider Note   CSN: [id number] Arrival date & time: 10/31/23  1546     Patient presents with: Arm Pain (right)   Judy Lindsey is a 88 y.o. female with history hypertension, presents with concern for ongoing right wrist and arm pain after her fall yesterday.  Denies any numbness or tingling in the right hand.  She was seen in the ER yesterday where no acute fracture was noted.  She was placed in a splint and sling for comfort.  However, she reports pain has not been well-controlled at home when taking oxycodone  every 6 hours.  Has not been taking Tylenol  or ibuprofen at home.    Arm Pain       Prior to Admission medications   Medication Sig Start Date End Date Taking? Authorizing Provider  amLODipine  (NORVASC ) 5 MG tablet Take 0.5 tablets (2.5 mg total) by mouth daily. 07/12/19   Jeffrie Oneil BROCKS, MD  benazepril  (LOTENSIN ) 20 MG tablet TAKE 1 TABLET BY MOUTH EVERY DAY 02/20/20   Jeffrie Oneil BROCKS, MD  betamethasone dipropionate (DIPROLENE) 0.05 % cream Apply 1 application topically daily as needed (eczema).  05/29/15   [provider]  COVID-19 mRNA bivalent vaccine, Moderna, (MODERNA COVID-19 BIVAL BOOSTER) 50 MCG/0.5ML injection Inject into the muscle. 03/11/21   Luiz Channel, MD  etodolac  (LODINE ) 300 MG capsule Take 1 capsule (300 mg total) by mouth 3 (three) times daily as needed. 04/29/23   Randol Simmonds, MD  ezetimibe (ZETIA) 10 MG tablet Take 10 mg by mouth daily.    [provider]  gabapentin (NEURONTIN) 100 MG capsule Take 100 mg by mouth at bedtime.    [provider]  ipratropium (ATROVENT ) 0.06 % nasal spray Place 2 sprays into both nostrils 2 (two) times daily as needed. 10/29/23 11/28/23  Karis Clunes, MD  levofloxacin  (LEVAQUIN ) 500 MG tablet Take 1 tablet (500 mg total) by mouth daily. 04/29/23   Randol Simmonds, MD  levothyroxine  (SYNTHROID ) 88 MCG tablet Take 88 mcg by mouth every morning. 06/10/19    [provider]  mupirocin  ointment (BACTROBAN ) 2 % Apply 1 Application topically 3 (three) times daily for 7 days. 10/29/23 11/05/23  Karis Clunes, MD  omeprazole (PRILOSEC) 40 MG capsule Take 40 mg by mouth daily as needed. 01/02/19   [provider]  ondansetron  (ZOFRAN -ODT) 4 MG disintegrating tablet Take 1 tablet (4 mg total) by mouth every 8 (eight) hours as needed for up to 15 doses for nausea or vomiting. 10/30/23   Cottie Donnice PARAS, MD  oxyCODONE  10 MG TABS Take 0.5 tablets (5 mg total) by mouth every 6 (six) hours as needed for up to 20 days for severe pain (pain score 7-10). 10/30/23 11/19/23  Cottie Donnice PARAS, MD  Probiotic Product (ALIGN PO) Take 1 tablet by mouth daily.    [provider]  rosuvastatin  (CRESTOR ) 10 MG tablet Take 10 mg by mouth daily.    [provider]  traMADol  (ULTRAM ) 50 MG tablet Take 50 mg by mouth every 6 (six) hours as needed for moderate pain.    [provider]    Allergies: Codeine, Nubain [nalbuphine], Atorvastatin, and Rosuvastatin     Review of Systems  Musculoskeletal:        Right wrist pain    Updated Vital Signs BP (!) 140/60   Pulse 62   Temp 99 F (37.2 C)   Resp 18   SpO2 96%   Physical Exam Vitals  and nursing note reviewed.  Constitutional:      Appearance: Normal appearance.  HENT:     Head: Normocephalic.     Comments: Ecchymoses to the right temple  Cardiovascular:     Comments: Cap refill less than 2 seconds in the digits of the right hand.  2+ radial pulse of the right upper extremity Pulmonary:     Effort: Pulmonary effort is normal.   Musculoskeletal:     Comments: Right upper extremity:  General Edema surrounding the right wrist diffusely. No obvious deformity or open wounds.  Palpation Tender to palpation over the snuffbox.  She does not have any tenderness palpation of the 1st through 5th metacarpals or phalanges.  Tender to palpation of the proximal aspect of the forearm,  mostly over the radius.  However, no tenderness palpation of the distal radius or ulna  Non-tender to palpation along the clavicle, humerus.  ROM Unable to perform range of motion of the right wrist due to pain.  Able to flex and extend at the right elbow without difficulty. Able to flex and extend at the 1st through 5th MCPs, PIP, DIP joints  Sensation: Sensation intact throughout the right upper extremity 1st-5th digits     Neurological:     General: No focal deficit present.     Mental Status: She is alert.   Psychiatric:        Mood and Affect: Mood normal.        Behavior: Behavior normal.     (all labs ordered are listed, but only abnormal results are displayed) Labs Reviewed - No data to display  EKG: None  Radiology: MR BRAIN WO CONTRAST Result Date: 10/30/2023 CLINICAL DATA:  Head trauma after fall this morning. EXAM: MRI HEAD WITHOUT CONTRAST TECHNIQUE: Multiplanar, multiecho pulse sequences of the brain and surrounding structures were obtained without intravenous contrast. COMPARISON:  None Available. FINDINGS: Brain: No acute infarct. No evidence of intracranial hemorrhage. Mild scattered T2/FLAIR hyperintensity in the periventricular and subcortical white matter. No edema, mass effect, or midline shift. Posterior fossa is unremarkable. Normal appearance of midline structures. The basilar cisterns are patent. No extra-axial fluid collections. Ventricles: Normal size and configuration of the ventricles. Vascular: Skull base flow voids are visualized. Skull and upper cervical spine: No focal abnormality. Sinuses/Orbits: Bilateral lens replacement. Paranasal sinuses are clear. Other: Mastoid air cells are clear. Focal soft tissue swelling over the right supraorbital ridge extending into the right frontal scalp and extending inferiorly along the lateral aspect of the right orbit. IMPRESSION: No acute intracranial abnormality. Mild chronic microvascular ischemic changes. Right  frontal scalp and right periorbital soft tissue swelling. Electronically Signed   By: Donnice Mania M.D.   On: 10/30/2023 14:29   DG Knee Complete 4 Views Right Result Date: 10/30/2023 CLINICAL DATA:  Fall.  Right-sided pain. EXAM: RIGHT KNEE - COMPLETE 4+ VIEW COMPARISON:  None Available. FINDINGS: There is diffuse decreased bone mineralization. Mild to moderate medial and mild lateral compartment joint space narrowing. Mild peripheral medial compartment degenerative spurring. Mild medial and lateral compartment chondrocalcinosis. Tiny ossicles overlying the intercondylar notch. Minimal superior patellar degenerative spurring. No joint effusion. No acute fracture or dislocation. IMPRESSION: 1. Mild to moderate medial and mild lateral and patellofemoral compartment osteoarthritis. 2. No acute fracture is seen. Electronically Signed   By: Tanda Lyons M.D.   On: 10/30/2023 13:47   DG Wrist Complete Right Result Date: 10/30/2023 CLINICAL DATA:  Fall on outstretched hand.  Pain. EXAM: RIGHT WRIST - COMPLETE  3+ VIEW; RIGHT ELBOW - COMPLETE 3+ VIEW; RIGHT FOREARM - 2 VIEW COMPARISON:  None Available. FINDINGS: There is diffuse decreased bone mineralization. Right wrist: Neutral ulnar variance. Moderate to severe triscaphe and thumb carpometacarpal joint space narrowing, subchondral sclerosis, subchondral cystic change, and peripheral osteophytosis. Moderate second carpometacarpal joint space narrowing. Moderate triangular fibrocartilage complex chondrocalcinosis. Moderate thumb interphalangeal joint space narrowing, subchondral sclerosis, and peripheral osteophytosis. Mild calcification dorsal to the proximal carpal row on lateral view is more proximal than would be expected for a triquetral avulsion fracture and is favored to be secondary to the calcification overlying the triangular fibrocartilage complex on frontal view. Right forearm: No acute fracture is seen within the radius or ulna. Normal alignment. Right  elbow: Minimal degenerative spurring at the medial elbow at the coronoid process. No joint effusion. No acute fracture is seen. No dislocation. IMPRESSION: 1. No acute fracture is seen within the right wrist, right forearm, or right elbow. 2. 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 Electronically Signed   By: Tanda Lyons M.D.   On: 10/30/2023 13:45   DG Forearm Right Result Date: 10/30/2023 CLINICAL DATA:  Fall on outstretched hand.  Pain. EXAM: RIGHT WRIST - COMPLETE 3+ VIEW; RIGHT ELBOW - COMPLETE 3+ VIEW; RIGHT FOREARM - 2 VIEW COMPARISON:  None Available. FINDINGS: There is diffuse decreased bone mineralization. Right wrist: Neutral ulnar variance. Moderate to severe triscaphe and thumb carpometacarpal joint space narrowing, subchondral sclerosis, subchondral cystic change, and peripheral osteophytosis. Moderate second carpometacarpal joint space narrowing. Moderate triangular fibrocartilage complex chondrocalcinosis. Moderate thumb interphalangeal joint space narrowing, subchondral sclerosis, and peripheral osteophytosis. Mild calcification dorsal to the proximal carpal row on lateral view is more proximal than would be expected for a triquetral avulsion fracture and is favored to be secondary to the calcification overlying the triangular fibrocartilage complex on frontal view. Right forearm: No acute fracture is seen within the radius or ulna. Normal alignment. Right elbow: Minimal degenerative spurring at the medial elbow at the coronoid process. No joint effusion. No acute fracture is seen. No dislocation. IMPRESSION: 1. No acute fracture is seen within the right wrist, right forearm, or right elbow. 2. 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 Electronically Signed   By: Tanda Lyons M.D.   On: 10/30/2023 13:45   DG Elbow Complete Right Result Date: 10/30/2023 CLINICAL DATA:  Fall on outstretched hand.  Pain. EXAM: RIGHT WRIST - COMPLETE  3+ VIEW; RIGHT ELBOW - COMPLETE 3+ VIEW; RIGHT FOREARM - 2 VIEW COMPARISON:  None Available. FINDINGS: There is diffuse decreased bone mineralization. Right wrist: Neutral ulnar variance. Moderate to severe triscaphe and thumb carpometacarpal joint space narrowing, subchondral sclerosis, subchondral cystic change, and peripheral osteophytosis. Moderate second carpometacarpal joint space narrowing. Moderate triangular fibrocartilage complex chondrocalcinosis. Moderate thumb interphalangeal joint space narrowing, subchondral sclerosis, and peripheral osteophytosis. Mild calcification dorsal to the proximal carpal row on lateral view is more proximal than would be expected for a triquetral avulsion fracture and is favored to be secondary to the calcification overlying the triangular fibrocartilage complex on frontal view. Right forearm: No acute fracture is seen within the radius or ulna. Normal alignment. Right elbow: Minimal degenerative spurring at the medial elbow at the coronoid process. No joint effusion. No acute fracture is seen. No dislocation. IMPRESSION: 1. No acute fracture is seen within the right wrist, right forearm, or right elbow. 2. 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 Electronically Signed   By: Tanda Lyons M.D.   On:  10/30/2023 13:45     Procedures   Medications Ordered in the ED  ketorolac  (TORADOL ) 15 MG/ML injection 15 mg (15 mg Intravenous Given 10/31/23 1641)  fentaNYL  (SUBLIMAZE ) injection 50 mcg (50 mcg Intravenous Given 10/31/23 1641)  acetaminophen  (TYLENOL ) tablet 1,000 mg (1,000 mg Oral Given 10/31/23 1733)  oxyCODONE  (Oxy IR/ROXICODONE ) immediate release tablet 5 mg (5 mg Oral Given 10/31/23 1734)    Clinical Course as of 10/31/23 1821  Sat Oct 31, 2023  1654 No concern for compartment syndrome  [AF]    Clinical Course User Index [AF] Veta Palma, PA-C                                 Medical Decision Making Risk OTC  drugs. Prescription drug management.     Differential diagnosis includes but is not limited to compartment syndrome, fracture, dislocation, soft tissue injury, bone contusion  ED Course:  Upon initial evaluation, patient is well-appearing, no acute distress.  Stable vitals.  Reporting pain in the right wrist and right forearm.  Splint was removed.  She is neurovascular intact in the right upper extremity.  Compartments soft.  No concern for compartment syndrome at this time.  She is tender over the right navicular and has diffuse swelling over the right wrist. Unable to perform ROM of right wrist due to pain. Also tender of the proximal right forearm diffusely. No obvious deformity.  She had x-rays of the right elbow, right forearm, and right wrist performed yesterday which were interpreted by radiology as being negative for any acute fracture or dislocation.  However, on my review of the wrist x-ray, it does appear there is a possible avulsion of the navicular.  Given she is tender in this area, we will splint with thumb spica.  She also requests splint go further up the arm to provide support for the pain in her upper forearm.  Although there is not a fracture, feel this may provide additional pain control since she is poorly controlled at home with medications. I did have a shared decision-making conversation with patient regarding obtaining repeat x-ray versus CT given her ongoing pain to rule out occult fracture.  Patient declines at this time.   Medications Given: Fentanyl , Toradol , Tylenol , oxycodone   Upon re-evaluation, patient reports pain better controlled with medications.  Was placed into a long-arm splint with thumb spica at the end for possible navicular injury.  She remains neurovascularly intact after splint placement.  She was not using Tylenol  and ibuprofen scheduled at home, and I feel she would benefit from scheduled Tylenol  and ibuprofen use to help better control her pain at  home.  We also discussed that she may take her oxycodone  every 4-6 hours as needed, as opposed to every 6 hours as needed.  She states she has an appointment with orthopedics in 3 days on 11/02/2023.  Stable and appropriate for discharge home    Impression: Right wrist (navicular) pain after fall  Disposition:  The patient was discharged home with instructions to alternate Tylenol  and ibuprofen.  May take the 5 mg of oxycodone  prescribed yesterday every 4-6 hours for breakthrough pain.  Attend her appointment with orthopedics on 11/03/2023 for follow up. Return precautions given.    Record Review: External records from outside source obtained and reviewed including ER note and imaging from yesterday     This chart was dictated using voice recognition software, Dragon. Despite the best efforts  of this provider to proofread and correct errors, errors may still occur which can change documentation meaning.       Final diagnoses:  Acute pain of right wrist    ED Discharge Orders     None          Veta Palma, PA-C 10/31/23 1821    Neysa Caron PARAS, DO 10/31/23 2355

## 2023-10-31 NOTE — Discharge Instructions (Addendum)
 Although your x-rays yesterday did not show any signs of a fracture or dislocation, it is possible there is a small fracture that was not picked up by the imaging.  Given your pain, you have been placed into a splint to hold your wrist in place.  Please keep this splint clean and dry.  Wrap the splint with a garbage bag or Saran wrap to keep it dry while showering or bathing.  Do not take off the splint.   You may take up to 1000mg  of tylenol  every 6 hours as needed for pain. Do not take more then 4g per day.   You may use up to 600mg  ibuprofen every 6 hours as needed for pain.  Do not exceed 2.4g of ibuprofen per day.  You may alternate these medications for complete coverage. See instructions below: Take 600mg  ibuprofen, then 3 hours later take 1000mg  tylenol , then 3 hours later 600mg  ibuprofen, then 3 hours later 1000mg  tylenol , so on and so forth for pain control.    You may use your prescribed Oxycodone  every 4-6 hours as needed for pain not controlled with the Tylenol  and ibuprofen-this is a narcotic/controlled substance medication that has potential addicting qualities.   Do not drive or operate heavy machinery when taking this medicine as it can be sedating. Do not drink alcohol or take other sedating medications when taking this medicine for safety reasons.  Keep this out of reach of small children.    Please attend your orthopedics appointment next week on 11/03/2023 for follow-up.  Return to the ER for any uncontrolled pain, numbness or tingling, discoloration, any other new or concerning symptoms.

## 2023-10-31 NOTE — ED Notes (Signed)
 Sling applied before discharge. Pt Pt d/c instructions, medications, and follow-up care reviewed with pt. Pt verbalized understanding and had no further questions at time of d/c. Pt CA&Ox4 and in NAD at time of d/c

## 2023-10-31 NOTE — Progress Notes (Signed)
 Patient ID: Judy Lindsey, female   DOB: 1935/10/01, 88 y.o.   MRN: [id number]  CC: Nasal sores, nasal crusting  HPI: The patient is an 88 year old female who presents today complaining of recurrent nasal sores and crusting.  The patient was previously seen for her chronic rhinitis and nasal drainage.  She was treated with Flonase and Atrovent .  The patient reports persistent postnasal drainage.  Currently she denies any facial pain, fever, or visual change.  Exam: General: Communicates without difficulty, well nourished, no acute distress. Head: Normocephalic, no evidence injury, no tenderness, facial buttresses intact without stepoff. Face/sinus: No tenderness to palpation and percussion. Facial movement is normal and symmetric. Eyes: PERRL, EOMI. No scleral icterus, conjunctivae clear. Neuro: CN II exam reveals vision grossly intact.  No nystagmus at any point of gaze. Ears: Auricles well formed without lesions.  Ear canals are intact without mass or lesion.  No erythema or edema is appreciated.  The TMs are intact without fluid. Nose: External evaluation reveals normal support and skin without lesions.  Dorsum is intact.  Anterior rhinoscopy reveals congested mucosa over anterior aspect of inferior turbinates and intact septum.  No purulence noted. Oral:  Oral cavity and oropharynx are intact, symmetric, without erythema or edema.  Mucosa is moist without lesions. Neck: Full range of motion without pain.  There is no significant lymphadenopathy.  No masses palpable.  Thyroid  bed within normal limits to palpation.  Parotid glands and submandibular glands equal bilaterally without mass.  Trachea is midline. Neuro:  CN 2-12 grossly intact.   Assessment: 1.  The patient's history is suggestive of nasal impetigo. 2.  Chronic rhinitis with nasal mucosal congestion and chronic postnasal drainage.  Plan: 1.  The physical exam findings are reviewed with the patient. 2.  Bactroban  ointment to treat the nasal  impetigo. 3.  Atrovent  refills to treat her chronic postnasal drainage. 4.  The patient is encouraged to call with any questions or concerns.

## 2023-10-31 NOTE — ED Triage Notes (Signed)
 Patient arrives POV with severe right arm/wrist pain. Patient was seen here yesterday for the same (patient had a mechanical fall). The pain has worsened and is she is now reporting swelling to fingers and tingling. Patient in splint on arrival. Rates pain a 10/10.

## 2023-11-03 ENCOUNTER — Ambulatory Visit (HOSPITAL_BASED_OUTPATIENT_CLINIC_OR_DEPARTMENT_OTHER): Admitting: Student

## 2023-11-03 DIAGNOSIS — M25531 Pain in right wrist: Secondary | ICD-10-CM

## 2023-11-03 DIAGNOSIS — M79631 Pain in right forearm: Secondary | ICD-10-CM | POA: Diagnosis not present

## 2023-11-03 NOTE — Progress Notes (Signed)
 Chief Complaint: Right wrist and forearm injury    Discussed the use of AI scribe software for clinical note transcription with the patient, who gave verbal consent to proceed.  History of Present Illness Judy Lindsey is an 88 year old female who presents with right wrist and forearm pain following a fall. She is accompanied by her daughter Inocente and her husband Carlin.  She fell on Friday, approximately four days ago, on her enclosed porch due to a wet rug, landing on her right side. This resulted in significant pain in her right wrist and forearm, with the wrist being the most painful. The pain is severe, especially with manipulation. She has been using a sling and splint for immobilization, which provides some relief, but pain persists.  She has been taking half of an oxycodone  10 mg every four to five hours for pain management. She has arthritis in the wrist. Initial numbness in her fingers has resolved. As she is right-handed, the injury significantly impacts her ability to use her dominant hand. Imaging was performed on her wrist and knee. She also has some pain and bruising in her knee, but it is less concerning than the arm injuries.   Surgical History:   None  PMH/PSH/Family History/Social History/Meds/Allergies:    Past Medical History:  Diagnosis Date   Arthritis    Fall 2004   mechanical fall, down stair, broke her leg   Heart murmur    Hemorrhoids    History of urinary tract infection    Hypertension    Hypothyroidism    Urinary incontinence    Past Surgical History:  Procedure Laterality Date   ABDOMINAL HYSTERECTOMY     ANKLE SURGERY     right had screw placed still has    APPENDECTOMY     arm surgery      had excess skin removed from under upper arm area bilat 05/16/2015    CHOLECYSTECTOMY     EYE SURGERY     cataract surgery bilat    HEMORRHOID SURGERY     HERNIA REPAIR     TONSILLECTOMY     TOTAL KNEE ARTHROPLASTY Left  07/10/2015   Procedure: LEFT TOTAL KNEE ARTHROPLASTY;  Surgeon: Donnice Car, MD;  Location: [facility];  Service: Orthopedics;  Laterality: Left;   tummy tuck      20 years ago    Social History   Socioeconomic History   Marital status: Married    Spouse name: Not on file   Number of children: Not on file   Years of education: Not on file   Highest education level: Not on file  Occupational History   Not on file  Tobacco Use   Smoking status: Never   Smokeless tobacco: Never  Substance and Sexual Activity   Alcohol use: Yes    Comment: occas glass of wine    Drug use: No   Sexual activity: Not on file  Other Topics Concern   Not on file  Social History Narrative   Not on file   Social Drivers of Health   Financial Resource Strain: Not on file  Food Insecurity: Not on file  Transportation Needs: Not on file  Physical Activity: Not on file  Stress: Not on file  Social Connections: Not on file   Family History  Problem  Relation Age of Onset   Parkinson's disease Mother    CAD Father    Allergies  Allergen Reactions   Codeine Nausea And Vomiting   Nubain [Nalbuphine] Nausea And Vomiting   Atorvastatin Other (See Comments)    Body aches   Rosuvastatin  Other (See Comments)    Body aches   Current Outpatient Medications  Medication Sig Dispense Refill   amLODipine  (NORVASC ) 5 MG tablet Take 0.5 tablets (2.5 mg total) by mouth daily. 45 tablet 3   benazepril  (LOTENSIN ) 20 MG tablet TAKE 1 TABLET BY MOUTH EVERY DAY 90 tablet 3   betamethasone dipropionate (DIPROLENE) 0.05 % cream Apply 1 application topically daily as needed (eczema).   0   COVID-19 mRNA bivalent vaccine, Moderna, (MODERNA COVID-19 BIVAL BOOSTER) 50 MCG/0.5ML injection Inject into the muscle. 0.5 mL 0   etodolac  (LODINE ) 300 MG capsule Take 1 capsule (300 mg total) by mouth 3 (three) times daily as needed. 21 capsule 0   ezetimibe (ZETIA) 10 MG tablet Take 10 mg by mouth daily.     gabapentin (NEURONTIN)  100 MG capsule Take 100 mg by mouth at bedtime.     ipratropium (ATROVENT ) 0.06 % nasal spray Place 2 sprays into both nostrils 2 (two) times daily as needed. 15 mL 12   levofloxacin  (LEVAQUIN ) 500 MG tablet Take 1 tablet (500 mg total) by mouth daily. 7 tablet 0   levothyroxine  (SYNTHROID ) 88 MCG tablet Take 88 mcg by mouth every morning.     mupirocin  ointment (BACTROBAN ) 2 % Apply 1 Application topically 3 (three) times daily for 7 days. 22 g 3   omeprazole (PRILOSEC) 40 MG capsule Take 40 mg by mouth daily as needed.     ondansetron  (ZOFRAN -ODT) 4 MG disintegrating tablet Take 1 tablet (4 mg total) by mouth every 8 (eight) hours as needed for up to 15 doses for nausea or vomiting. 15 tablet 0   oxyCODONE  10 MG TABS Take 0.5 tablets (5 mg total) by mouth every 6 (six) hours as needed for up to 20 days for severe pain (pain score 7-10). 20 tablet 0   Probiotic Product (ALIGN PO) Take 1 tablet by mouth daily.     rosuvastatin  (CRESTOR ) 10 MG tablet Take 10 mg by mouth daily.     traMADol  (ULTRAM ) 50 MG tablet Take 50 mg by mouth every 6 (six) hours as needed for moderate pain.     No current facility-administered medications for this visit.   No results found.  Review of Systems:   A ROS was performed including pertinent positives and negatives as documented in the HPI.  Physical Exam :   Constitutional: NAD and appears stated age Neurological: Alert and oriented Psych: Appropriate affect and cooperative There were no vitals taken for this visit.   Comprehensive Musculoskeletal Exam:    Exam of the right wrist and elbow demonstrates moderate swelling about the right wrist with no obvious deformity.  Tenderness at the first [facility] joint and anatomical snuffbox.  Wrist range of motion very minimal due to pain.  Unable to form a composite fist.  Tenderness in the proximal third of the forearm, more radial sided without overlying edema or ecchymosis.  Active elbow ROM from 30 to 100 degrees  without pain.  Radial pulse 2+.  Distal neurosensory exam intact.  Imaging:   Xray review from 10/30/2023 (right wrist 3 views, right forearm 2 views): Significant first CMC and tri scaphoid osteoarthritis. Cortical irregularity along the radial aspect of the scaphoid  indeterminate for fracture versus degenerative change. TFCC calcifications. No evidence of fracture or dislocation within the forearm or elbow.   I personally reviewed and interpreted the radiographs.      Assessment & Plan Wrist pain with possible scaphoid fracture   Severe right wrist pain with a suspected scaphoid fracture is complicated by arthritis. Imaging reviewed today is inconclusive. Will plan to treat accordingly with immobilization. Given swelling present today, will hold off on consideration of casting and will have her remain in the long arm splint. Will plan to reassess at the end of the week to determine next steps and if any further imaging is necessary.  Right forearm pain Patient continues to experience pain in the proximal radial forearm. X-rays show no conclusive evidence of fracture. No significant tenderness within the elbow, but she does have pain with pronation and supination. Will have her remain in the splint and sling. Could consider long arm cast if no improvement or high suspicion for occult fracture.  Constipation due to pain medication   Constipation is likely from opioid use, with no bowel movement in four days despite Miralax . Opioid-induced constipation and the need to balance pain management with bowel function were discussed. Continue Miralax  and monitor bowel movements. Recommend good fiber intake and can also utilize stool softener. Consider alternative pain management if constipation persists.     I personally saw and evaluated the patient, and participated in the management and treatment plan.  Leonce Reveal, PA-C Orthopedics

## 2023-11-06 ENCOUNTER — Encounter (HOSPITAL_BASED_OUTPATIENT_CLINIC_OR_DEPARTMENT_OTHER): Payer: Self-pay | Admitting: Student

## 2023-11-06 ENCOUNTER — Ambulatory Visit (HOSPITAL_BASED_OUTPATIENT_CLINIC_OR_DEPARTMENT_OTHER): Admitting: Student

## 2023-11-06 DIAGNOSIS — M79631 Pain in right forearm: Secondary | ICD-10-CM

## 2023-11-06 DIAGNOSIS — M25531 Pain in right wrist: Secondary | ICD-10-CM

## 2023-11-06 NOTE — Progress Notes (Signed)
 Chief Complaint: Right wrist and forearm injury    History of Present Illness  11/06/23: Patient presents today for follow-up of a right wrist and forearm injury.  She has remained in a sling and long-arm splint.  Overall she reports no significant changes in her symptoms but  does note some discomfort in the shoulder that has been there since the injury.  She is taking oxycodone  5 mg every 6-8 hours with occasional ibuprofen.   11/03/23: Judy Lindsey is an 88 year old female who presents with right wrist and forearm pain following a fall. She is accompanied by her daughter Inocente and her husband Carlin. She fell on Friday, approximately four days ago, on her enclosed porch due to a wet rug, landing on her right side. This resulted in significant pain in her right wrist and forearm, with the wrist being the most painful. The pain is severe, especially with manipulation. She has been using a sling and splint for immobilization, which provides some relief, but pain persists. She has been taking half of an oxycodone  10 mg every four to five hours for pain management. She has arthritis in the wrist. Initial numbness in her fingers has resolved. As she is right-handed, the injury significantly impacts her ability to use her dominant hand. Imaging was performed on her wrist and knee. She also has some pain and bruising in her knee, but it is less concerning than the arm injuries.   Surgical History:   None  PMH/PSH/Family History/Social History/Meds/Allergies:    Past Medical History:  Diagnosis Date   Arthritis    Fall 2004   mechanical fall, down stair, broke her leg   Heart murmur    Hemorrhoids    History of urinary tract infection    Hypertension    Hypothyroidism    Urinary incontinence    Past Surgical History:  Procedure Laterality Date   ABDOMINAL HYSTERECTOMY     ANKLE SURGERY     right had screw placed still has    APPENDECTOMY     arm  surgery      had excess skin removed from under upper arm area bilat 05/16/2015    CHOLECYSTECTOMY     EYE SURGERY     cataract surgery bilat    HEMORRHOID SURGERY     HERNIA REPAIR     TONSILLECTOMY     TOTAL KNEE ARTHROPLASTY Left 07/10/2015   Procedure: LEFT TOTAL KNEE ARTHROPLASTY;  Surgeon: Donnice Car, MD;  Location: [facility];  Service: Orthopedics;  Laterality: Left;   tummy tuck      20 years ago    Social History   Socioeconomic History   Marital status: Married    Spouse name: Not on file   Number of children: Not on file   Years of education: Not on file   Highest education level: Not on file  Occupational History   Not on file  Tobacco Use   Smoking status: Never   Smokeless tobacco: Never  Substance and Sexual Activity   Alcohol use: Yes    Comment: occas glass of wine    Drug use: No   Sexual activity: Not on file  Other Topics Concern   Not on file  Social History Narrative   Not on file   Social Drivers of Health  Financial Resource Strain: Not on file  Food Insecurity: Not on file  Transportation Needs: Not on file  Physical Activity: Not on file  Stress: Not on file  Social Connections: Not on file   Family History  Problem Relation Age of Onset   Parkinson's disease Mother    CAD Father    Allergies  Allergen Reactions   Codeine Nausea And Vomiting   Nubain [Nalbuphine] Nausea And Vomiting   Atorvastatin Other (See Comments)    Body aches   Rosuvastatin  Other (See Comments)    Body aches   Current Outpatient Medications  Medication Sig Dispense Refill   amLODipine  (NORVASC ) 5 MG tablet Take 0.5 tablets (2.5 mg total) by mouth daily. 45 tablet 3   benazepril  (LOTENSIN ) 20 MG tablet TAKE 1 TABLET BY MOUTH EVERY DAY 90 tablet 3   betamethasone dipropionate (DIPROLENE) 0.05 % cream Apply 1 application topically daily as needed (eczema).   0   COVID-19 mRNA bivalent vaccine, Moderna, (MODERNA COVID-19 BIVAL BOOSTER) 50 MCG/0.5ML injection  Inject into the muscle. 0.5 mL 0   etodolac  (LODINE ) 300 MG capsule Take 1 capsule (300 mg total) by mouth 3 (three) times daily as needed. 21 capsule 0   ezetimibe (ZETIA) 10 MG tablet Take 10 mg by mouth daily.     gabapentin (NEURONTIN) 100 MG capsule Take 100 mg by mouth at bedtime.     ipratropium (ATROVENT ) 0.06 % nasal spray Place 2 sprays into both nostrils 2 (two) times daily as needed. 15 mL 12   levofloxacin  (LEVAQUIN ) 500 MG tablet Take 1 tablet (500 mg total) by mouth daily. 7 tablet 0   levothyroxine  (SYNTHROID ) 88 MCG tablet Take 88 mcg by mouth every morning.     omeprazole (PRILOSEC) 40 MG capsule Take 40 mg by mouth daily as needed.     ondansetron  (ZOFRAN -ODT) 4 MG disintegrating tablet Take 1 tablet (4 mg total) by mouth every 8 (eight) hours as needed for up to 15 doses for nausea or vomiting. 15 tablet 0   oxyCODONE  10 MG TABS Take 0.5 tablets (5 mg total) by mouth every 6 (six) hours as needed for up to 20 days for severe pain (pain score 7-10). 20 tablet 0   Probiotic Product (ALIGN PO) Take 1 tablet by mouth daily.     rosuvastatin  (CRESTOR ) 10 MG tablet Take 10 mg by mouth daily.     traMADol  (ULTRAM ) 50 MG tablet Take 50 mg by mouth every 6 (six) hours as needed for moderate pain.     No current facility-administered medications for this visit.   No results found.  Review of Systems:   A ROS was performed including pertinent positives and negatives as documented in the HPI.  Physical Exam :   Constitutional: NAD and appears stated age Neurological: Alert and oriented Psych: Appropriate affect and cooperative There were no vitals taken for this visit.   Comprehensive Musculoskeletal Exam:    Right wrist exam demonstrates mild diffuse swelling and ecchymosis.  Significant tenderness particular in the radial aspect of the wrist over the snuffbox and first CMC.  Very limited active wrist range of motion due to discomfort.  Radial pulse 2+.  There is some slight  discomfort still in the radial aspect of the proximal forearm although this has improved from last exam.  No tenderness within the elbow joint.  Imaging:       Assessment & Plan Wrist pain with possible scaphoid fracture   Patient continues to demonstrate significant  pain, swelling, ecchymosis, and decreased range of motion within the right wrist after a fall now 1 week ago.  She does have some significant evidence of wrist and CMC arthritis, however given her ongoing symptoms and would like to obtain a CT scan to rule out an occult fracture.  Long-arm splint was removed today and I placed her in a removable thumb spica brace.  She can continue with sling usage as needed.  In terms of pain control, we will try to have her switch to tramadol  with addition of Tylenol  and/or ibuprofen when she finishes current course of oxycodone .  She will try this and update us  on her pain levels.  Will plan update on patient about plan concerning follow-up and possible occupational therapy once CT results are obtained.   Right forearm pain Patient does continue to experience some tenderness within the proximal forearm although this has significantly improved on exam since prior visit.  X-rays of the forearm are negative for any bony abnormality.  Will plan to immobilize the wrist and she can continue to use the sling as needed for comfort.     I personally saw and evaluated the patient, and participated in the management and treatment plan.  Leonce Reveal, PA-C Orthopedics

## 2023-11-14 ENCOUNTER — Ambulatory Visit (HOSPITAL_BASED_OUTPATIENT_CLINIC_OR_DEPARTMENT_OTHER)
Admission: RE | Admit: 2023-11-14 | Discharge: 2023-11-14 | Disposition: A | Discharge status: Home / Self Care | Source: Ambulatory Visit | Attending: Student | Admitting: Student

## 2023-11-14 DIAGNOSIS — M25531 Pain in right wrist: Secondary | ICD-10-CM | POA: Insufficient documentation

## 2023-11-14 DIAGNOSIS — M19041 Primary osteoarthritis, right hand: Secondary | ICD-10-CM | POA: Diagnosis not present

## 2023-11-17 DIAGNOSIS — Z961 Presence of intraocular lens: Secondary | ICD-10-CM | POA: Diagnosis not present

## 2023-11-27 ENCOUNTER — Ambulatory Visit (HOSPITAL_BASED_OUTPATIENT_CLINIC_OR_DEPARTMENT_OTHER): Admitting: Student

## 2023-11-30 DIAGNOSIS — M47816 Spondylosis without myelopathy or radiculopathy, lumbar region: Secondary | ICD-10-CM | POA: Diagnosis not present

## 2023-11-30 DIAGNOSIS — M542 Cervicalgia: Secondary | ICD-10-CM | POA: Diagnosis not present

## 2023-12-02 DIAGNOSIS — S6991XA Unspecified injury of right wrist, hand and finger(s), initial encounter: Secondary | ICD-10-CM | POA: Diagnosis not present

## 2023-12-02 DIAGNOSIS — M19011 Primary osteoarthritis, right shoulder: Secondary | ICD-10-CM | POA: Diagnosis not present

## 2023-12-08 ENCOUNTER — Ambulatory Visit (HOSPITAL_COMMUNITY): Admit: 2023-12-08 | Admitting: General Surgery

## 2023-12-08 SURGERY — REPAIR, HERNIA, VENTRAL, ROBOT-ASSISTED
Anesthesia: General

## 2023-12-16 DIAGNOSIS — M25531 Pain in right wrist: Secondary | ICD-10-CM | POA: Diagnosis not present

## 2023-12-16 DIAGNOSIS — M19011 Primary osteoarthritis, right shoulder: Secondary | ICD-10-CM | POA: Diagnosis not present

## 2023-12-28 DIAGNOSIS — K432 Incisional hernia without obstruction or gangrene: Secondary | ICD-10-CM | POA: Diagnosis not present

## 2024-01-04 DIAGNOSIS — Z1231 Encounter for screening mammogram for malignant neoplasm of breast: Secondary | ICD-10-CM | POA: Diagnosis not present

## 2024-01-13 DIAGNOSIS — M25531 Pain in right wrist: Secondary | ICD-10-CM | POA: Diagnosis not present

## 2024-01-19 DIAGNOSIS — M19031 Primary osteoarthritis, right wrist: Secondary | ICD-10-CM | POA: Diagnosis not present

## 2024-01-26 DIAGNOSIS — R300 Dysuria: Secondary | ICD-10-CM | POA: Diagnosis not present

## 2024-02-08 DIAGNOSIS — K589 Irritable bowel syndrome without diarrhea: Secondary | ICD-10-CM | POA: Diagnosis not present

## 2024-02-08 DIAGNOSIS — Z0000 Encounter for general adult medical examination without abnormal findings: Secondary | ICD-10-CM | POA: Diagnosis not present

## 2024-02-08 DIAGNOSIS — Q253 Supravalvular aortic stenosis: Secondary | ICD-10-CM | POA: Diagnosis not present

## 2024-02-08 DIAGNOSIS — M810 Age-related osteoporosis without current pathological fracture: Secondary | ICD-10-CM | POA: Diagnosis not present

## 2024-02-08 DIAGNOSIS — I10 Essential (primary) hypertension: Secondary | ICD-10-CM | POA: Diagnosis not present

## 2024-02-08 DIAGNOSIS — E039 Hypothyroidism, unspecified: Secondary | ICD-10-CM | POA: Diagnosis not present

## 2024-02-08 DIAGNOSIS — E7800 Pure hypercholesterolemia, unspecified: Secondary | ICD-10-CM | POA: Diagnosis not present

## 2024-02-08 DIAGNOSIS — Z23 Encounter for immunization: Secondary | ICD-10-CM | POA: Diagnosis not present

## 2024-02-09 DIAGNOSIS — N390 Urinary tract infection, site not specified: Secondary | ICD-10-CM | POA: Diagnosis not present

## 2024-02-18 DIAGNOSIS — M1711 Unilateral primary osteoarthritis, right knee: Secondary | ICD-10-CM | POA: Diagnosis not present

## 2024-02-22 ENCOUNTER — Other Ambulatory Visit: Payer: Self-pay

## 2024-02-22 ENCOUNTER — Encounter (HOSPITAL_COMMUNITY): Payer: Self-pay | Admitting: General Surgery

## 2024-02-22 NOTE — Anesthesia Preprocedure Evaluation (Signed)
 Anesthesia Evaluation  Patient identified by MRN, date of birth, ID band Patient awake    Reviewed: Allergy & Precautions, H&P , NPO status , Patient's Chart, lab work & pertinent test results  Airway Mallampati: III  TM Distance: >3 FB Neck ROM: Full    Dental  (+) Teeth Intact, Dental Advisory Given   Pulmonary neg pulmonary ROS   Pulmonary exam normal breath sounds clear to auscultation       Cardiovascular hypertension (162/68 preop- took benazepril  this AM), Pt. on medications pulmonary hypertension (mild pHTN on echo)Normal cardiovascular exam+ Valvular Problems/Murmurs (mild-mod AS) AS  Rhythm:Regular Rate:Normal   Echo 06/27/2022 showed LVEF 60 to 65%, grade 1 DD, normal RV, RVSP 40.3 mmHg, mild to moderate aortic valve stenosis (MG 16 mmHg, AVA 1.20 cm).    Neuro/Psych negative neurological ROS  negative psych ROS   GI/Hepatic negative GI ROS, Neg liver ROS,,,  Endo/Other  Hypothyroidism    Renal/GU negative Renal ROS  negative genitourinary   Musculoskeletal  (+) Arthritis , Osteoarthritis,    Abdominal   Peds negative pediatric ROS (+)  Hematology negative hematology ROS (+)   Anesthesia Other Findings   Reproductive/Obstetrics negative OB ROS                              Anesthesia Physical Anesthesia Plan  ASA: 3  Anesthesia Plan: General   Post-op Pain Management: Tylenol  PO (pre-op)*   Induction: Intravenous  PONV Risk Score and Plan: 3 and Ondansetron , Dexamethasone  and Treatment may vary due to age or medical condition  Airway Management Planned: Oral ETT  Additional Equipment: None  Intra-op Plan:   Post-operative Plan: Extubation in OR  Informed Consent: I have reviewed the patients History and Physical, chart, labs and discussed the procedure including the risks, benefits and alternatives for the proposed anesthesia with the patient or authorized  representative who has indicated his/her understanding and acceptance.     Dental advisory given  Plan Discussed with: CRNA  Anesthesia Plan Comments: ( )         Anesthesia Quick Evaluation

## 2024-02-22 NOTE — Progress Notes (Signed)
 SDW call  Patient was given pre-op instructions over the phone. Patient verbalized understanding of instructions provided.     PCP - Dr. Velna Skeeter Cardiologist - Dr. Oneil Parchment Pulmonary:    PPM/ICD - denies Device Orders - na Rep Notified - na   Chest x-ray - 04/29/2023 EKG -  07/31/2023 Stress Test - ECHO - 06/27/2022 Cardiac Cath -   Sleep Study/sleep apnea/CPAP: denies  Non-diabetic  Blood Thinner Instructions: denies  Aspirin  Instructions:denies   ERAS Protcol - Clears until 0955   Anesthesia review: Yes. HTN, cardiac clearance   Patient denies shortness of breath, fever, cough and chest pain over the phone call  Your procedure is scheduled on Tuesday February 23, 2024  Report to [facility] Main Entrance A at  1025  A.M., then check in with the Admitting office.  Call this number if you have problems the morning of surgery:  [phone]   If you have any questions prior to your surgery date call [phone]: Open Monday-Friday 8am-4pm If you experience any cold or flu symptoms such as cough, fever, chills, shortness of breath, etc. between now and your scheduled surgery, please notify us  at the above number    Remember:  Do not eat after midnight the night before your surgery  You may drink clear liquids until  0955   the morning of your surgery.   Clear liquids allowed are: Water, Non-Citrus Juices (without pulp), Carbonated Beverages, Clear Tea, Black Coffee ONLY (NO MILK, CREAM OR POWDERED CREAMER of any kind), and Gatorade   Take these medicines the morning of surgery with A SIP OF WATER:  Amlodipine , zetia, synthroid , amitiza, tramadol   As needed: atrovent   As of today, STOP taking any Aspirin  (unless otherwise instructed by your surgeon) Aleve, Naproxen, Ibuprofen, Motrin, Advil, Goody's, BC's, all herbal medications, fish oil, and all vitamins. This includes your celebrex 

## 2024-02-22 NOTE — Progress Notes (Signed)
 Anesthesia Chart Review: Same day workup  88 year old female follows with cardiology for history of hypertension and moderate aortic stenosis.  Echo 06/27/2022 showed LVEF 60 to 65%, grade 1 DD, normal RV, RVSP 40.3 mmHg, mild to moderate aortic valve stenosis (MG 16 mmHg, AVA 1.20 cm).  Patient was previously cleared for this procedure and telephone encounter by Lum Louis, NP on 09/25/2023 stating, Chart reviewed as part of pre-operative protocol coverage. Given past medical history and time since last visit, based on ACC/AHA guidelines, Judy Lindsey is at acceptable risk for the planned procedure without further cardiovascular testing.  Other pertinent history includes hypothyroidism.  She will need day of surgery labs and evaluation.  EKG 07/31/2023: Normal sinus rhythm.  Rate 70. Left axis deviation  TTE 06/27/2022:   1. Left ventricular ejection fraction, by estimation, is 60 to 65%. The  left ventricle has normal function. The left ventricle has no regional  wall motion abnormalities. Left ventricular diastolic parameters are  consistent with Grade I diastolic  dysfunction (impaired relaxation).   2. Right ventricular systolic function is normal. The right ventricular  size is normal. There is mildly elevated pulmonary artery systolic  pressure. The estimated right ventricular systolic pressure is 40.3 mmHg.   3. Left atrial size was mildly dilated.   4. The mitral valve is normal in structure. Trivial mitral valve  regurgitation. No evidence of mitral stenosis.   5. The aortic valve is tricuspid. There is moderate calcification of the  aortic valve. Aortic valve regurgitation is not visualized. Mild to  moderate aortic valve stenosis. Aortic valve area, by VTI measures 1.20  cm. Aortic valve mean gradient measures   16.0 mmHg.   6. The inferior vena cava is normal in size with <50% respiratory  variability, suggesting right atrial pressure of 8 mmHg.      Lynwood Geofm RIGGERS [facility]/Anesthesiology Phone [phone] 02/22/2024 12:49 PM

## 2024-02-23 ENCOUNTER — Ambulatory Visit (HOSPITAL_COMMUNITY): Payer: Self-pay | Admitting: Physician Assistant

## 2024-02-23 ENCOUNTER — Other Ambulatory Visit: Payer: Self-pay

## 2024-02-23 ENCOUNTER — Ambulatory Visit (HOSPITAL_COMMUNITY)
Admission: RE | Admit: 2024-02-23 | Discharge: 2024-02-23 | Disposition: A | Discharge status: Home / Self Care | Attending: General Surgery | Admitting: General Surgery

## 2024-02-23 ENCOUNTER — Encounter (HOSPITAL_COMMUNITY): Payer: Self-pay | Admitting: General Surgery

## 2024-02-23 ENCOUNTER — Encounter (HOSPITAL_COMMUNITY)
Admission: RE | Disposition: A | Discharge status: Home / Self Care | Payer: Self-pay | Source: Home / Self Care | Attending: General Surgery

## 2024-02-23 DIAGNOSIS — K439 Ventral hernia without obstruction or gangrene: Secondary | ICD-10-CM | POA: Diagnosis not present

## 2024-02-23 DIAGNOSIS — I350 Nonrheumatic aortic (valve) stenosis: Secondary | ICD-10-CM | POA: Insufficient documentation

## 2024-02-23 DIAGNOSIS — K4090 Unilateral inguinal hernia, without obstruction or gangrene, not specified as recurrent: Secondary | ICD-10-CM | POA: Insufficient documentation

## 2024-02-23 DIAGNOSIS — I272 Pulmonary hypertension, unspecified: Secondary | ICD-10-CM | POA: Insufficient documentation

## 2024-02-23 DIAGNOSIS — K4190 Unilateral femoral hernia, without obstruction or gangrene, not specified as recurrent: Secondary | ICD-10-CM | POA: Diagnosis not present

## 2024-02-23 DIAGNOSIS — E039 Hypothyroidism, unspecified: Secondary | ICD-10-CM | POA: Diagnosis not present

## 2024-02-23 DIAGNOSIS — E7800 Pure hypercholesterolemia, unspecified: Secondary | ICD-10-CM

## 2024-02-23 DIAGNOSIS — Z9181 History of falling: Secondary | ICD-10-CM | POA: Diagnosis not present

## 2024-02-23 DIAGNOSIS — Z9889 Other specified postprocedural states: Secondary | ICD-10-CM | POA: Diagnosis not present

## 2024-02-23 DIAGNOSIS — I10 Essential (primary) hypertension: Secondary | ICD-10-CM | POA: Diagnosis not present

## 2024-02-23 DIAGNOSIS — K660 Peritoneal adhesions (postprocedural) (postinfection): Secondary | ICD-10-CM | POA: Insufficient documentation

## 2024-02-23 DIAGNOSIS — K432 Incisional hernia without obstruction or gangrene: Secondary | ICD-10-CM | POA: Insufficient documentation

## 2024-02-23 HISTORY — PX: XI ROBOTIC ASSISTED VENTRAL HERNIA: SHX6789

## 2024-02-23 LAB — BASIC METABOLIC PANEL WITH GFR
Anion gap: 10 (ref 5–15)
BUN: 9 mg/dL (ref 8–23)
CO2: 23 mmol/L (ref 22–32)
Calcium: 8.8 mg/dL — ABNORMAL LOW (ref 8.9–10.3)
Chloride: 104 mmol/L (ref 98–111)
Creatinine, Ser: 0.52 mg/dL (ref 0.44–1.00)
GFR, Estimated: >60 mL/min (ref >60)
Glucose, Bld: 87 mg/dL (ref 70–99)
Potassium: 4 mmol/L (ref 3.5–5.1)
Sodium: 137 mmol/L (ref 135–145)

## 2024-02-23 LAB — CBC
HCT: 38.5 % (ref 36.0–46.0)
Hemoglobin: 12.7 g/dL (ref 12.0–15.0)
MCH: 30.4 pg (ref 26.0–34.0)
MCHC: 33 g/dL (ref 30.0–36.0)
MCV: 92.1 fL (ref 80.0–100.0)
Platelets: 257 K/uL (ref 150–400)
RBC: 4.18 MIL/uL (ref 3.87–5.11)
RDW: 13.2 % (ref 11.5–15.5)
WBC: 7.9 K/uL (ref 4.0–10.5)
nRBC: 0 % (ref 0.0–0.2)

## 2024-02-23 SURGERY — REPAIR, HERNIA, VENTRAL, ROBOT-ASSISTED
Anesthesia: General

## 2024-02-23 MED ORDER — CEFAZOLIN SODIUM-DEXTROSE 2-3 GM-%(50ML) IV SOLR
INTRAVENOUS | Status: DC | PRN
  Administered 2024-02-23: 2 g via INTRAVENOUS

## 2024-02-23 MED ORDER — PROPOFOL 10 MG/ML IV BOLUS
INTRAVENOUS | Status: DC | PRN
Start: 2024-02-23 — End: 2024-02-23
  Administered 2024-02-23: 100 mg via INTRAVENOUS

## 2024-02-23 MED ORDER — AMISULPRIDE (ANTIEMETIC) 5 MG/2ML IV SOLN
10.0000 mg | Freq: Once | INTRAVENOUS | Status: AC
  Administered 2024-02-23: 10 mg via INTRAVENOUS

## 2024-02-23 MED ORDER — HYDROMORPHONE HCL 1 MG/ML IJ SOLN
0.5000 mg | INTRAMUSCULAR | Status: DC | PRN
  Administered 2024-02-23 (×3): 0.5 mg via INTRAVENOUS

## 2024-02-23 MED ORDER — HYDRALAZINE HCL 20 MG/ML IJ SOLN
INTRAMUSCULAR | Status: DC | PRN
  Administered 2024-02-23: 5 mg via INTRAVENOUS

## 2024-02-23 MED ORDER — LIDOCAINE 2% (20 MG/ML) 5 ML SYRINGE
INTRAMUSCULAR | Status: AC
Start: 2024-02-23 — End: 2024-02-23
  Filled 2024-02-23: qty 5

## 2024-02-23 MED ORDER — LIDOCAINE HCL (CARDIAC) PF 100 MG/5ML IV SOSY: PREFILLED_SYRINGE | INTRAVENOUS | Status: DC | PRN

## 2024-02-23 MED ORDER — SUGAMMADEX SODIUM 200 MG/2ML IV SOLN
INTRAVENOUS | Status: DC | PRN
  Administered 2024-02-23: 200 mg via INTRAVENOUS

## 2024-02-23 MED ORDER — FENTANYL CITRATE (PF) 100 MCG/2ML IJ SOLN
25.0000 ug | INTRAMUSCULAR | Status: DC | PRN
  Administered 2024-02-23: 25 ug via INTRAVENOUS
  Administered 2024-02-23: 50 ug via INTRAVENOUS
  Administered 2024-02-23: 25 ug via INTRAVENOUS

## 2024-02-23 MED ORDER — ONDANSETRON HCL 4 MG/2ML IJ SOLN
INTRAMUSCULAR | Status: AC
  Filled 2024-02-23: qty 2

## 2024-02-23 MED ORDER — FENTANYL CITRATE (PF) 250 MCG/5ML IJ SOLN
INTRAMUSCULAR | Status: AC
  Filled 2024-02-23: qty 5

## 2024-02-23 MED ORDER — PROPOFOL 10 MG/ML IV BOLUS
INTRAVENOUS | Status: AC
  Filled 2024-02-23: qty 20

## 2024-02-23 MED ORDER — KETOROLAC TROMETHAMINE 15 MG/ML IJ SOLN
INTRAMUSCULAR | Status: AC
Start: 2024-02-23 — End: 2024-02-23
  Filled 2024-02-23: qty 1

## 2024-02-23 MED ORDER — KETOROLAC TROMETHAMINE 15 MG/ML IJ SOLN
15.0000 mg | Freq: Once | INTRAMUSCULAR | Status: AC
  Administered 2024-02-23: 15 mg via INTRAVENOUS

## 2024-02-23 MED ORDER — AMISULPRIDE (ANTIEMETIC) 5 MG/2ML IV SOLN
INTRAVENOUS | Status: AC
Start: 2024-02-23 — End: 2024-02-23
  Filled 2024-02-23: qty 4

## 2024-02-23 MED ORDER — TRAMADOL HCL 50 MG PO TABS: 50.0000 mg | ORAL_TABLET | Freq: Four times a day (QID) | ORAL | 0 refills | Status: AC | PRN

## 2024-02-23 MED ORDER — VISTASEAL 10 ML SINGLE DOSE KIT
10.0000 mL | PACK | Freq: Once | CUTANEOUS | Status: DC
  Filled 2024-02-23: qty 10

## 2024-02-23 MED ORDER — HYDROMORPHONE HCL 1 MG/ML IJ SOLN
INTRAMUSCULAR | Status: AC
  Filled 2024-02-23: qty 1

## 2024-02-23 MED ORDER — OXYCODONE-ACETAMINOPHEN 5-325 MG PO TABS: 1.0000 | ORAL_TABLET | ORAL | 0 refills | Status: AC | PRN

## 2024-02-23 MED ORDER — ONDANSETRON HCL 4 MG/2ML IJ SOLN
INTRAMUSCULAR | Status: DC | PRN
  Administered 2024-02-23: 4 mg via INTRAVENOUS

## 2024-02-23 MED ORDER — FENTANYL CITRATE (PF) 100 MCG/2ML IJ SOLN
INTRAMUSCULAR | Status: AC
  Filled 2024-02-23: qty 2

## 2024-02-23 MED ORDER — LACTATED RINGERS IV SOLN: INTRAVENOUS | Status: DC

## 2024-02-23 MED ORDER — ROCURONIUM BROMIDE 100 MG/10ML IV SOLN
INTRAVENOUS | Status: DC | PRN
  Administered 2024-02-23: 60 mg via INTRAVENOUS
  Administered 2024-02-23: 20 mg via INTRAVENOUS

## 2024-02-23 MED ORDER — ACETAMINOPHEN 500 MG PO TABS
1000.0000 mg | ORAL_TABLET | Freq: Once | ORAL | Status: AC
  Administered 2024-02-23: 1000 mg via ORAL
  Filled 2024-02-23: qty 2

## 2024-02-23 MED ORDER — ORAL CARE MOUTH RINSE: 15.0000 mL | Freq: Once | OROMUCOSAL | Status: AC

## 2024-02-23 MED ORDER — LIDOCAINE 2% (20 MG/ML) 5 ML SYRINGE
INTRAMUSCULAR | Status: DC | PRN
  Administered 2024-02-23: 50 mg via INTRAVENOUS

## 2024-02-23 MED ORDER — DEXAMETHASONE SOD PHOSPHATE PF 10 MG/ML IJ SOLN
INTRAMUSCULAR | Status: DC | PRN
  Administered 2024-02-23: 8 mg via INTRAVENOUS

## 2024-02-23 MED ORDER — ONDANSETRON HCL 4 MG/2ML IJ SOLN: 4.0000 mg | Freq: Once | INTRAMUSCULAR | Status: DC | PRN

## 2024-02-23 MED ORDER — BUPIVACAINE HCL 0.25 % IJ SOLN
INTRAMUSCULAR | Status: DC | PRN
  Administered 2024-02-23: 14 mL

## 2024-02-23 MED ORDER — FENTANYL CITRATE (PF) 250 MCG/5ML IJ SOLN
INTRAMUSCULAR | Status: DC | PRN
  Administered 2024-02-23 (×5): 50 ug via INTRAVENOUS

## 2024-02-23 MED ORDER — LACTATED RINGERS IV SOLN: INTRAVENOUS | Status: DC | PRN

## 2024-02-23 MED ORDER — CHLORHEXIDINE GLUCONATE 0.12 % MT SOLN
15.0000 mL | Freq: Once | OROMUCOSAL | Status: AC
  Administered 2024-02-23: 15 mL via OROMUCOSAL
  Filled 2024-02-23: qty 15

## 2024-02-23 MED ORDER — CEFAZOLIN SODIUM-DEXTROSE 2-4 GM/100ML-% IV SOLN
INTRAVENOUS | Status: AC
  Filled 2024-02-23: qty 100

## 2024-02-23 SURGICAL SUPPLY — 50 items
APPLICATOR VISTASEAL 35 (MISCELLANEOUS) IMPLANT
BAG COUNTER SPONGE SURGICOUNT (BAG) IMPLANT
BINDER ABDOMINAL 10 UNV 27-48 (MISCELLANEOUS) IMPLANT
BINDER ABDOMINAL 12 ML 46-62 (SOFTGOODS) IMPLANT
CHLORAPREP W/TINT 26 (MISCELLANEOUS) ×2 IMPLANT
COVER MAYO STAND STRL (DRAPES) ×2 IMPLANT
COVER SURGICAL LIGHT HANDLE (MISCELLANEOUS) ×2 IMPLANT
COVER TIP SHEARS 8 DVNC (MISCELLANEOUS) ×2 IMPLANT
DEFOGGER SCOPE WARM SEASHARP (MISCELLANEOUS) ×2 IMPLANT
DERMABOND ADVANCED .7 DNX12 (GAUZE/BANDAGES/DRESSINGS) ×2 IMPLANT
DEVICE SECURE STRAP 25 ABSORB (INSTRUMENTS) IMPLANT
DEVICE TROCAR PUNCTURE CLOSURE (ENDOMECHANICALS) ×2 IMPLANT
DRAPE ARM DVNC X/XI (DISPOSABLE) ×8 IMPLANT
DRAPE COLUMN DVNC XI (DISPOSABLE) ×2 IMPLANT
DRAPE CV SPLIT W-CLR ANES SCRN (DRAPES) ×2 IMPLANT
DRAPE SURG ORHT 6 SPLT 77X108 (DRAPES) ×2 IMPLANT
DRIVER NDL MEGA SUTCUT DVNCXI (INSTRUMENTS) ×2 IMPLANT
DRIVER NDLE MEGA SUTCUT DVNCXI (INSTRUMENTS) ×1 IMPLANT
FORCEPS BPLR FENES DVNC XI (FORCEP) ×2 IMPLANT
FORCEPS PROGRASP DVNC XI (FORCEP) ×2 IMPLANT
GLOVE BIO SURGEON STRL SZ7.5 (GLOVE) ×4 IMPLANT
GLOVE SURG LATEX 7.5 PF (GLOVE) ×4 IMPLANT
GOWN STRL REUS W/ TWL LRG LVL3 (GOWN DISPOSABLE) ×4 IMPLANT
GOWN STRL REUS W/ TWL XL LVL3 (GOWN DISPOSABLE) ×4 IMPLANT
GOWN STRL REUS W/TWL 2XL LVL3 (GOWN DISPOSABLE) ×2 IMPLANT
GRASPER SUT TROCAR 14GX15 (MISCELLANEOUS) IMPLANT
IRRIGATION SUCT STRKRFLW 2 WTP (MISCELLANEOUS) IMPLANT
KIT BASIN OR (CUSTOM PROCEDURE TRAY) ×2 IMPLANT
KIT TURNOVER KIT B (KITS) ×2 IMPLANT
MARKER SKIN DUAL TIP RULER LAB (MISCELLANEOUS) ×2 IMPLANT
MESH VENTRALIGHT ST 4X6IN (Mesh General) IMPLANT
NDL HYPO 22X1.5 SAFETY MO (MISCELLANEOUS) ×2 IMPLANT
NEEDLE HYPO 22X1.5 SAFETY MO (MISCELLANEOUS) ×1 IMPLANT
PAD ARMBOARD POSITIONER FOAM (MISCELLANEOUS) ×4 IMPLANT
PENCIL SMOKE EVACUATOR (MISCELLANEOUS) IMPLANT
SCISSORS LAP 5X35 DISP (ENDOMECHANICALS) IMPLANT
SCISSORS MNPLR CVD DVNC XI (INSTRUMENTS) ×2 IMPLANT
SEAL UNIV 5-12 XI (MISCELLANEOUS) ×6 IMPLANT
SET TUBE SMOKE EVAC HIGH FLOW (TUBING) ×2 IMPLANT
SPIKE FLUID TRANSFER (MISCELLANEOUS) ×2 IMPLANT
STOPCOCK 4 WAY LG BORE MALE ST (IV SETS) ×2 IMPLANT
SUT ETHIBOND 0 36 GRN (SUTURE) IMPLANT
SUT MNCRL AB 4-0 PS2 18 (SUTURE) ×2 IMPLANT
SUT STRATA 2-0 15 CT-1.5 (SUTURE) ×2 IMPLANT
SUT STRATA PDS 2-0 23 CT-1 (SUTURE) IMPLANT
SUT STRATAFIX PDS+0 CT1 9 (SUTURE) IMPLANT
SUT STRATAFIX SPIRAL PDS+ 0 30 (SUTURE) IMPLANT
TOWEL GREEN STERILE FF (TOWEL DISPOSABLE) ×2 IMPLANT
TRAY LAPAROSCOPIC MC (CUSTOM PROCEDURE TRAY) ×2 IMPLANT
TROCAR XCEL NON-BLD 5MMX100MML (ENDOMECHANICALS) IMPLANT

## 2024-02-23 NOTE — Anesthesia Postprocedure Evaluation (Signed)
 Anesthesia Post Note  Patient: Irisha A Haring  Procedure(s) Performed: REPAIR, HERNIA, VENTRAL, ROBOT-ASSISTED     Patient location during evaluation: PACU Anesthesia Type: General Level of consciousness: awake and alert, oriented and patient cooperative Pain management: pain level controlled Vital Signs Assessment: post-procedure vital signs reviewed and stable Respiratory status: spontaneous breathing, nonlabored ventilation and respiratory function stable Cardiovascular status: blood pressure returned to baseline and stable Postop Assessment: no apparent nausea or vomiting Anesthetic complications: no   No notable events documented.  Last Vitals:  Vitals:   02/23/24 1430 02/23/24 1445  BP: (!) 153/62 132/61  Pulse: 76 67  Resp: 18 16  Temp:    SpO2:  93%     Pain Goal:                   Almarie CHRISTELLA Marchi

## 2024-02-23 NOTE — Discharge Instructions (Signed)

## 2024-02-23 NOTE — H&P (Signed)
 Judy Lindsey is a 88 y.o. female who presents for Recheck  (rob vhr w/mesh 7/29, nervous about upcoming surgery wants to come in and talk to Dr. Maayan Jenning/) HPI History of Present Illness Judy Lindsey is an 88 year old female who presents for a discussion about hernia surgery. She was referred by her primary care physician for evaluation of her hernia.   She has a right-sided hernia that occasionally bothers her, especially when standing for long periods, leading her to hold the area for comfort. She has noticed a slight worsening over the past year.   She is apprehensive about surgery due to her age and previous surgical experiences, including a tummy tuck during which a hernia was found and repaired. Her brother also had a hernia repaired with mesh.   She mentions a previous fall that affected her side, which led to rescheduling the surgery.   Review of Systems  Constitutional:  Negative for chills and fever.  HENT:  Negative for ear pain and sore throat.   Eyes:  Negative for pain and visual disturbance.  Respiratory:  Negative for cough and shortness of breath.   Cardiovascular:  Negative for chest pain and palpitations.  Gastrointestinal:  Negative for abdominal pain and vomiting.  Genitourinary:  Negative for dysuria and hematuria.  Musculoskeletal:  Negative for arthralgias and back pain.  Skin:  Negative for color change and rash.  Neurological:  Negative for seizures and syncope.  All other systems reviewed and are negative.     There is no problem list on file for this patient.           Outpatient Medications Prior to Visit  Medication Sig Dispense Refill   amLODIPine  (NORVASC ) 2.5 MG tablet Take 2.5 mg by mouth once daily       benazepriL  (LOTENSIN ) 20 MG tablet Take 20 mg by mouth once daily       ezetimibe (ZETIA) 10 mg tablet Take 10 mg by mouth once daily       ipratropium (ATROVENT ) 0.06 % nasal spray USE 1 TO 2 SPRAYS IN EACH NOSTRIL TWICE DAILY AS NEEDED FOR DRAINAGE        levothyroxine  (SYNTHROID ) 88 MCG tablet take 1 tablet by mouth daily in the morning on an empty stomach       lubiprostone (AMITIZA) 24 MCG capsule Take by mouth       traMADoL  (ULTRAM ) 50 mg tablet TAKE 1 TABLET BY MOUTH EVERY 8-12 HOURS AS NEEDED FOR PAIN        No facility-administered medications prior to visit.        Objective      Vitals:    12/28/23 0859  PainSc: 0-No pain    There is no height or weight on file to calculate BMI.   Home Vitals:      Physical Exam Physical Exam   PE:   Constitutional: No acute distress, conversant, appears states age. Eyes: Anicteric sclerae, moist conjunctiva, no lid lag Lungs: Clear to auscultation bilaterally, normal respiratory effort CV: regular rate and rhythm, no murmurs, no peripheral edema, pedal pulses 2+ GI: Soft, no masses or hepatosplenomegaly, non-tender to palpation, RLQ hernia Skin: No rashes, palpation reveals normal turgor Psychiatric: appropriate judgment and insight, oriented to person, place, and time   Results RADIOLOGY Abdominal CT: Hernia located superior to the inguinal region, with omental fat plugging the hernia, no bowel involvement.       Assessment/Plan:    Assessment & Plan Recurrent right femoral and  incisional abdominal wall hernias without obstruction or gangrene The hernia is located on the right side with fat plugging the hernia rather than intestines. There is no immediate danger of bowel obstruction or strangulation, but potential for enlargement over time. She experiences discomfort, particularly when standing for extended periods, but it does not significantly impact daily activities. Surgery is recommended due to potential future complications and her current good health status, which may change with age. The procedure involves mesh placement to prevent recurrence, with acknowledged surgical risks but expected quick recovery. - Proceed with hernia repair surgery on October 13th. - Advise  no lifting more than twenty pounds for six weeks post-surgery. - Encourage ambulation and normal activities as tolerated post-surgery. Diagnoses and all orders for this visit:   Incisional hernia without obstruction or gangrene

## 2024-02-23 NOTE — Anesthesia Procedure Notes (Signed)
 Procedure Name: Intubation Date/Time: 02/23/2024 11:52 AM  Performed by: Deeann Eva BROCKS, CRNAPre-anesthesia Checklist: Patient identified, Emergency Drugs available, Suction available and Patient being monitored Patient Re-evaluated:Patient Re-evaluated prior to induction Oxygen Delivery Method: Circle System Utilized Preoxygenation: Pre-oxygenation with 100% oxygen Induction Type: IV induction Ventilation: Mask ventilation without difficulty Laryngoscope Size: Mac and 3 Grade View: Grade II Tube type: Oral Tube size: 7.0 mm Number of attempts: 1 Airway Equipment and Method: Stylet and Oral airway Placement Confirmation: ETT inserted through vocal cords under direct vision, positive ETCO2 and breath sounds checked- equal and bilateral Secured at: 21 cm Tube secured with: Tape Dental Injury: Teeth and Oropharynx as per pre-operative assessment

## 2024-02-23 NOTE — Op Note (Signed)
 02/23/2024  1:15 PM  PATIENT:  Judy Lindsey  88 y.o. female  PRE-OPERATIVE DIAGNOSIS:  VENTRAL HERNIA AND RIGHT FEMORAL AND INDIRECT INGUINAL HERNIA  POST-OPERATIVE DIAGNOSIS:  4CM VENTRAL HERNIA, RIGHT FEMORAL AND INDIRECT INGUINAL HERNIA  PROCEDURE:  Procedure(s) with comments: REPAIR, HERNIA, VENTRAL, ROBOT-ASSISTED (N/A) - RIGHT ROBOTIC FEMORAL AND INGUINAL HERNIA REPAIR WITH MESH AND LAP  VENTRAL HERNIA REPAIR WITH MESH TAPP  SURGEON:  Surgeons and Role:    * Rubin Calamity, MD - Primary  ASSISTANTS: Waddell Collier, RNFA   ANESTHESIA:   local and general  EBL:  minimal   BLOOD ADMINISTERED:none  DRAINS: none   LOCAL MEDICATIONS USED:  BUPIVICAINE   SPECIMEN:  No Specimen  DISPOSITION OF SPECIMEN:  N/A  COUNTS:  YES  TOURNIQUET:  * No tourniquets in log *  DICTATION: .Dragon Dictation Findings: Patient with a 4 cm right lower quadrant incisional hernia.  This was repaired with a 10 x 15 cm mesh in a horizontal fashion. Patient also with a right femoral and indirect inguinal hernia.  A piece of 3D max large mesh was placed to repair the femoral and indirect hernia concurrently.   Details of the procedure:   The patient was taken back to the operating room. The patient was placed in supine position with bilateral SCDs in place.  The patient was prepped and draped in the usual sterile fashion.  After appropriate anitbiotics were confirmed, a time-out was confirmed and all facts were verified.  At this time a Veress needle technique was used to inspect the abdomen approximately 10 cm from the umbilicus and the left paramedian line. This time a 8 mm robotic trocar was placed into the abdomen. The camera was placed there was no injury to any intra-abdominal organs. A 8mm umbilical port was placed just left upper quadrant.  An 8 mm port was placed epigastrium under direct visualization.  There was some omental adhesions taken down sharply with scissors.  Robot was  positioned over the patient and the ports were docked in the usual fashion.  At this time I could visualize the incisional hernia at the previous ostomy site.  We are very close with the robotic trocars.  I decided to proceed with the right inguinal hernia repair.  At this time the right-sided peritoneum was taken down from the medial umbilical ligament laterally. The pre-peritoneal space was entered. Dissection was taken down to Cooper's ligament. At this time it was apparent there was an indirect hernia.  At this time proceeded to dissect out the rest Cooper's ligament and the medial to lateral direction. A proceeded laterally to dissect the spermatic cord. The round ligament was visualized and transected with cautery.   I dissected the femoral space.  There was some fat-containing hernia in the femoral space.  This was dissected back.  At this time I proceeded to create a pocket laterally for the mesh. Once the pocket was created the peritoneum was stripped back to approximately the base of the cord. At this time 3D max Bard mesh, large was in placed into the dissected area. This covered both the direct and indirect and femoral spaces appropriately. The mesh lay flat from medial to lateral. At this time a 2-0 V- lock stitch was used to secure the mesh to Cooper's ligament medially.  Another 2-0.  V-Loc suture was used to was used to close the peritoneum in a standard running fashion.  At this time I proceeded to undocked the robot and repair the  ventral hernia laparoscopically.  An 8 mm trocar was repositioned in the left upper quadrant along the Palmer's point area.  At this time the fascia at the hernia site was brought together using interrupted 0 Vicryl's and a PMI.  After doing so a piece of 10 x 15 cm Bard Ventralight mesh was placed into the abdomen.  This brought up to the anterior abdominal wall.  This was circumferentially intact using a secure strap.  In a double crown fashion.  This lay flat.   This over laid the hernia by approximately 4 cm circumferentially.  At this time the left lower quadrant trocar site was reapproximate using a 0 Vicryl and a PMI.  Insufflation was evacuated.  All trocars were removed.  The skin was reapproximated all trocar sites using 4-0 Monocryl.  The patient the procedure well was taken to the recovery room in stable condition.  Type of ventral wall repair:  Laparoscopic underlay repair .with Primary repair of largest hernia Placement of mesh: Intraperitoneal underlay repair Name of mesh: Bard Ventralight dual sided (polypropylene / Seprafilm) Size of mesh: 15x10cm Orientation: Transverse Mesh overlap:  4cm    PLAN OF CARE: Discharge to home after PACU  PATIENT DISPOSITION:  PACU - hemodynamically stable.   Delay start of Pharmacological VTE agent (>24hrs) due to surgical blood loss or risk of bleeding: not applicable

## 2024-02-23 NOTE — Transfer of Care (Signed)
 Immediate Anesthesia Transfer of Care Note  Patient: Judy Lindsey  Procedure(s) Performed: REPAIR, HERNIA, VENTRAL, ROBOT-ASSISTED  Patient Location: PACU  Anesthesia Type:General  Level of Consciousness: awake  Airway & Oxygen Therapy: Patient Spontanous Breathing and Patient connected to face mask oxygen  Post-op Assessment: Report given to RN and Post -op Vital signs reviewed and stable  Post vital signs: Reviewed and stable  Last Vitals:  Vitals Value Taken Time  BP 157/68 02/23/24 13:30  Temp 36 C 02/23/24 13:30  Pulse 76 02/23/24 13:31  Resp 14 02/23/24 13:31  SpO2 99 % 02/23/24 13:31  Vitals shown include unfiled device data.  Last Pain:  Vitals:   02/23/24 0932  TempSrc:   PainSc: 0-No pain         Complications: No notable events documented.

## 2024-02-24 ENCOUNTER — Encounter (HOSPITAL_COMMUNITY): Payer: Self-pay | Admitting: General Surgery

## 2024-03-01 DIAGNOSIS — M1711 Unilateral primary osteoarthritis, right knee: Secondary | ICD-10-CM | POA: Diagnosis not present

## 2024-03-09 DIAGNOSIS — D2272 Melanocytic nevi of left lower limb, including hip: Secondary | ICD-10-CM | POA: Diagnosis not present

## 2024-03-09 DIAGNOSIS — L820 Inflamed seborrheic keratosis: Secondary | ICD-10-CM | POA: Diagnosis not present

## 2024-03-09 DIAGNOSIS — D692 Other nonthrombocytopenic purpura: Secondary | ICD-10-CM | POA: Diagnosis not present

## 2024-03-09 DIAGNOSIS — D225 Melanocytic nevi of trunk: Secondary | ICD-10-CM | POA: Diagnosis not present

## 2024-03-14 ENCOUNTER — Encounter: Payer: Self-pay | Admitting: Radiology

## 2024-03-14 DIAGNOSIS — K080 Exfoliation of teeth due to systemic causes: Secondary | ICD-10-CM | POA: Diagnosis not present

## 2024-03-18 DIAGNOSIS — Z4889 Encounter for other specified surgical aftercare: Secondary | ICD-10-CM | POA: Diagnosis not present

## 2024-03-18 DIAGNOSIS — Z8719 Personal history of other diseases of the digestive system: Secondary | ICD-10-CM | POA: Diagnosis not present

## 2024-08-01 ENCOUNTER — Ambulatory Visit: Admitting: Cardiology
